# Patient Record
Sex: Female | Born: 1970 | Race: White | Hispanic: No | Marital: Married | Smoking: Never smoker
Health system: Southern US, Community
[De-identification: ages and names within clinical notes are randomized; demographics above are authoritative.]

## PROBLEM LIST (undated history)

## (undated) DIAGNOSIS — T7840XA Allergy, unspecified, initial encounter: Secondary | ICD-10-CM

## (undated) DIAGNOSIS — F419 Anxiety disorder, unspecified: Secondary | ICD-10-CM

## (undated) DIAGNOSIS — E039 Hypothyroidism, unspecified: Secondary | ICD-10-CM

## (undated) DIAGNOSIS — G43909 Migraine, unspecified, not intractable, without status migrainosus: Secondary | ICD-10-CM

## (undated) HISTORY — DX: Anxiety disorder, unspecified: F41.9

## (undated) HISTORY — PX: OTHER SURGICAL HISTORY: SHX169

## (undated) HISTORY — DX: Allergy, unspecified, initial encounter: T78.40XA

## (undated) HISTORY — DX: Migraine, unspecified, not intractable, without status migrainosus: G43.909

---

## 2000-02-24 HISTORY — PX: LEEP: SHX91

## 2010-08-13 LAB — HM PAP SMEAR: HM Pap smear: NORMAL

## 2011-08-31 ENCOUNTER — Encounter: Payer: Self-pay | Admitting: Internal Medicine

## 2011-08-31 ENCOUNTER — Ambulatory Visit (INDEPENDENT_AMBULATORY_CARE_PROVIDER_SITE_OTHER): Payer: PRIVATE HEALTH INSURANCE | Admitting: Internal Medicine

## 2011-08-31 VITALS — BP 110/68 | HR 76 | Temp 98.2°F | Resp 16 | Ht 67.75 in | Wt 194.5 lb

## 2011-08-31 DIAGNOSIS — F3281 Premenstrual dysphoric disorder: Secondary | ICD-10-CM

## 2011-08-31 DIAGNOSIS — F410 Panic disorder [episodic paroxysmal anxiety] without agoraphobia: Secondary | ICD-10-CM | POA: Insufficient documentation

## 2011-08-31 DIAGNOSIS — J309 Allergic rhinitis, unspecified: Secondary | ICD-10-CM

## 2011-08-31 DIAGNOSIS — G43909 Migraine, unspecified, not intractable, without status migrainosus: Secondary | ICD-10-CM

## 2011-08-31 DIAGNOSIS — R4589 Other symptoms and signs involving emotional state: Secondary | ICD-10-CM

## 2011-08-31 DIAGNOSIS — J302 Other seasonal allergic rhinitis: Secondary | ICD-10-CM

## 2011-08-31 DIAGNOSIS — N943 Premenstrual tension syndrome: Secondary | ICD-10-CM

## 2011-08-31 DIAGNOSIS — G43109 Migraine with aura, not intractable, without status migrainosus: Secondary | ICD-10-CM

## 2011-08-31 DIAGNOSIS — Z8619 Personal history of other infectious and parasitic diseases: Secondary | ICD-10-CM

## 2011-08-31 MED ORDER — SPIRONOLACTONE 50 MG PO TABS: 50.0000 mg | ORAL_TABLET | Freq: Every day | ORAL | Status: DC

## 2011-08-31 MED ORDER — SUMATRIPTAN SUCCINATE 100 MG PO TABS: 100.0000 mg | ORAL_TABLET | ORAL | Status: DC | PRN

## 2011-08-31 MED ORDER — PROPRANOLOL HCL 10 MG PO TABS: ORAL_TABLET | ORAL | Status: DC

## 2011-08-31 NOTE — Patient Instructions (Addendum)
Continue prenatal vitamins.  Trial of propranolol as needed for anxiety /stage fright  Spironolactone , a diuretic for use pre menstrual for edema  name brand imitrex.,,  if too $$$$ we will switch to maxalt or rel pax  We will get your old labs to see if you hav enay sings of anemia,.  etc

## 2011-08-31 NOTE — Assessment & Plan Note (Signed)
trial of spironolactone for edema

## 2011-08-31 NOTE — Assessment & Plan Note (Signed)
She wants to avoid medication that cannot be taken during pregnancy. prior trial of Wellbutrin and concerta for primary symptom of trouble concentrating in the "open" environment of her work place .  trial of propranolol

## 2011-08-31 NOTE — Progress Notes (Addendum)
Patient ID: Christy Fernandez, female   DOB: February 27, 1970, 41 y.o.   MRN: [id number] Patient Active Problem List  Diagnosis  . Migraine  . Premenstrual syndrome  . Panic disorder  . History of Lyme disease    Subjective:  CC:   Chief Complaint  Patient presents with  . New Patient    HPI:   Christy Fernandez is a 41 y.o. female who presents as a new patient to establish primary care with the chief complaint of  New patient . Has a 2 yr old  Child.  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   Married 10 yrs.  History of Lymes Disease in her late 20's    Treated with lots of abx,aggravated her  chronic premenstrual migraines.  Return to [facility] in 2005 ,  MD in [place] stopped everything, has headaches now once a month, around the time of migraines.    Past Medical History  Diagnosis Date  . Migraine     Past Surgical History  Procedure Date  . Septoplasty     Family History  Problem Relation Age of Onset  . Cancer Maternal Grandmother     colon  . Cancer Maternal Grandfather     colon  . Cancer Paternal Grandmother     breast   . Diabetes Paternal Grandfather     History   Social History  . Marital Status: Married    Spouse Name: N/A    Number of Children: N/A  . Years of Education: N/A   Occupational History  . Not on file.   Social History Main Topics  . Smoking status: Never Smoker   . Smokeless tobacco: Never Used  . Alcohol Use: Yes  . Drug Use: No  . Sexually Active: Not on file   Other Topics Concern  . Not on file   Social History Narrative  . No narrative on file         @ALLHX @    Review of Systems:   The remainder of the review of systems was negative except those addressed in the HPI.    Objective:  BP 110/68  Pulse 76  Temp 98.2 F (36.8 C) (Oral)  Resp 16  Ht 5' 7.75" (1.721 m)  Wt 194 lb 8 oz (88.225 kg)  BMI 29.79 kg/m2  SpO2 99%  LMP 08/24/2011  General appearance: alert, cooperative and  appears stated age Ears: normal TM's and external ear canals both ears Throat: lips, mucosa, and tongue normal; teeth and gums normal Neck: no adenopathy, no carotid bruit, supple, symmetrical, trachea midline and thyroid not enlarged, symmetric, no tenderness/mass/nodules Back: symmetric, no curvature. ROM normal. No CVA tenderness. Lungs: clear to auscultation bilaterally Heart: regular rate and rhythm, S1, S2 normal, no murmur, click, rub or gallop Abdomen: soft, non-tender; bowel sounds normal; no masses,  no organomegaly Pulses: 2+ and symmetric Skin: Skin color, texture, turgor normal. No rashes or lesions Lymph nodes: Cervical, supraclavicular, and axillary nodes normal.  Assessment and Plan:  Migraine resume brand name Imitrex,  Discussed Maxalt and rel pax  Premenstrual syndrome trial of spironolactone for edema  Panic disorder She wants to avoid medication that cannot be taken during pregnancy. prior trial of Wellbutrin and concerta for primary symptom of trouble concentrating in the "open" environment of her work place .  trial of propranolol   Updated Medication List Outpatient Encounter Prescriptions as of 08/31/2011  Medication Sig Dispense Refill  . mometasone (NASONEX) 50 MCG/ACT  nasal spray Place 2 sprays into the nose daily.      . Multiple Vitamin (MULTIVITAMIN) tablet Take 1 tablet by mouth daily.      . propranolol (INDERAL) 10 MG tablet 1 or 2 tablets as needed for anxiety  30 tablet  1  . spironolactone (ALDACTONE) 50 MG tablet Take 1 tablet (50 mg total) by mouth daily. As needed for fluid retention  30 tablet  3  . SUMAtriptan (IMITREX) 100 MG tablet Take 1 tablet (100 mg total) by mouth every 2 (two) hours as needed for migraine.  10 tablet  0  . DISCONTD: SUMAtriptan (IMITREX) 100 MG tablet Take 100 mg by mouth every 2 (two) hours as needed. Brand only         Orders Placed This Encounter  Procedures  . HM PAP SMEAR    No Follow-up on file.

## 2011-08-31 NOTE — Assessment & Plan Note (Signed)
resume brand name Imitrex,  Discussed Maxalt and rel pax

## 2011-09-01 ENCOUNTER — Encounter: Payer: Self-pay | Admitting: Internal Medicine

## 2011-09-02 ENCOUNTER — Other Ambulatory Visit: Payer: Self-pay | Admitting: *Deleted

## 2011-09-02 ENCOUNTER — Telehealth: Payer: Self-pay | Admitting: Internal Medicine

## 2011-09-02 MED ORDER — SUMATRIPTAN SUCCINATE 100 MG PO TABS: 100.0000 mg | ORAL_TABLET | ORAL | Status: DC | PRN

## 2011-09-02 NOTE — Telephone Encounter (Signed)
[facility] called and stated the manufacturer really don't make brand Imitrex anymore and it is hard to find.  She stated the generic is the exact same thing and wanted to know if that can be prescribed.  Please advise.

## 2011-09-02 NOTE — Telephone Encounter (Signed)
Yes please ask them to fill it with generic .  See chart

## 2011-09-03 NOTE — Telephone Encounter (Signed)
Pharmacy notified.

## 2011-11-19 ENCOUNTER — Telehealth: Payer: Self-pay | Admitting: Internal Medicine

## 2011-11-19 DIAGNOSIS — E559 Vitamin D deficiency, unspecified: Secondary | ICD-10-CM

## 2011-11-19 MED ORDER — ERGOCALCIFEROL 1.25 MG (50000 UT) PO CAPS: 50000.0000 [IU] | ORAL_CAPSULE | ORAL | Status: DC

## 2011-11-19 NOTE — Telephone Encounter (Signed)
Patient notified

## 2011-11-19 NOTE — Assessment & Plan Note (Signed)
Level of 20 tx with drisdol 50K weekly x 4

## 2011-11-19 NOTE — Telephone Encounter (Signed)
Recent labs reviewed. Cholesterol is fine except for triglycerides are little bit high at 195 (normal is 0-150) diet and exercise should take care of that. Her thyroid function was normal ; CBC was normal.   Vitamin D was low at 20 .  I recommend taking a megadose of vitamin D weekly for 4 weeks which I will call into her pharmacy. After she finishes to 4 weeks she should resume over-the-counter vitamin D 1000 units daily.

## 2011-12-18 ENCOUNTER — Other Ambulatory Visit: Payer: Self-pay | Admitting: Internal Medicine

## 2011-12-18 ENCOUNTER — Other Ambulatory Visit: Payer: Self-pay | Admitting: General Practice

## 2011-12-18 MED ORDER — ERGOCALCIFEROL 1.25 MG (50000 UT) PO CAPS: 50000.0000 [IU] | ORAL_CAPSULE | ORAL | Status: DC

## 2012-03-02 ENCOUNTER — Encounter: Payer: Self-pay | Admitting: Internal Medicine

## 2012-03-02 ENCOUNTER — Ambulatory Visit (INDEPENDENT_AMBULATORY_CARE_PROVIDER_SITE_OTHER): Payer: BC Managed Care – PPO | Admitting: Internal Medicine

## 2012-03-02 VITALS — BP 108/76 | HR 87 | Temp 98.0°F | Resp 16 | Wt 190.8 lb

## 2012-03-02 DIAGNOSIS — D239 Other benign neoplasm of skin, unspecified: Secondary | ICD-10-CM

## 2012-03-02 DIAGNOSIS — Z124 Encounter for screening for malignant neoplasm of cervix: Secondary | ICD-10-CM | POA: Insufficient documentation

## 2012-03-02 DIAGNOSIS — Z1331 Encounter for screening for depression: Secondary | ICD-10-CM

## 2012-03-02 DIAGNOSIS — S4980XA Other specified injuries of shoulder and upper arm, unspecified arm, initial encounter: Secondary | ICD-10-CM

## 2012-03-02 DIAGNOSIS — S46909A Unspecified injury of unspecified muscle, fascia and tendon at shoulder and upper arm level, unspecified arm, initial encounter: Secondary | ICD-10-CM

## 2012-03-02 DIAGNOSIS — F410 Panic disorder [episodic paroxysmal anxiety] without agoraphobia: Secondary | ICD-10-CM

## 2012-03-02 DIAGNOSIS — D229 Melanocytic nevi, unspecified: Secondary | ICD-10-CM

## 2012-03-02 DIAGNOSIS — S4992XA Unspecified injury of left shoulder and upper arm, initial encounter: Secondary | ICD-10-CM | POA: Insufficient documentation

## 2012-03-02 MED ORDER — SPIRONOLACTONE 50 MG PO TABS: 50.0000 mg | ORAL_TABLET | Freq: Every day | ORAL | Status: DC

## 2012-03-02 NOTE — Progress Notes (Signed)
Patient ID: Christy Fernandez, female   DOB: [birth year], 42 y.o.   MRN: [id number]  Patient Active Problem List  Diagnosis  . Migraine  . Panic disorder  . History of Lyme disease  . Vitamin D deficiency  . Injury of shoulder, left    Subjective:  CC:   Chief Complaint  Patient presents with  . Follow-up    HPI:   Christy Fernandez a 42 y.o. female who presents 6 month follow up on several chronic conditions,    She has noticed that her menstrual periods have become heavier but her migraines have improved.  Her husband has accepted a job in [place] blowing glass . She continues to have episodes of facial flushing related to socially tense situations, usually at work. The propranolol has helped with most of her symptoms. She is requesting a referral to dermatology for evaluation of several atypical moles.  She continues to have left shoulder pain after an inury that was treated by the staff [place] with prednisone and MR.  She has been doing her own rehab for the past several weeks with weights up to 20 lbs and has increased pain by the end of the day .     Past Medical History  Diagnosis Date  . Migraine     Past Surgical History  Procedure Date  . Septoplasty   . Leep 2002         The following portions of the patient's history were reviewed and updated as appropriate: Allergies, current medications, and problem list.    Review of Systems:   Patient denies headache, fevers, malaise, unintentional weight loss, skin rash, eye pain, sinus congestion and sinus pain, sore throat, dysphagia,  hemoptysis , cough, dyspnea, wheezing, chest pain, palpitations, orthopnea, edema, abdominal pain, nausea, melena, diarrhea, constipation, flank pain, dysuria, hematuria, urinary  Frequency, nocturia, numbness, tingling, seizures,  Focal weakness, Loss of consciousness,  Tremor, insomnia, depression, anxiety, and suicidal ideation.      History   Social History  . Marital Status: Married   Spouse Name: N/A    Number of Children: N/A  . Years of Education: N/A   Occupational History  . Not on file.   Social History Main Topics  . Smoking status: Never Smoker   . Smokeless tobacco: Never Used  . Alcohol Use: Yes  . Drug Use: No  . Sexually Active: Not on file   Other Topics Concern  . Not on file   Social History Narrative  . No narrative on file    Objective:  BP 108/76  Pulse 87  Temp 98 F (36.7 C) (Oral)  Resp 16  Wt 190 lb 12 oz (86.524 kg)  SpO2 98%  LMP 02/10/2012  General appearance: alert, cooperative and appears stated age Ears: normal TM's and external ear canals both ears Throat: lips, mucosa, and tongue normal; teeth and gums normal Neck: no adenopathy, no carotid bruit, supple, symmetrical, trachea midline and thyroid not enlarged, symmetric, no tenderness/mass/nodules Back: symmetric, no curvature. ROM normal. No CVA tenderness. Lungs: clear to auscultation bilaterally Heart: regular rate and rhythm, S1, S2 normal, no murmur, click, rub or gallop Abdomen: soft, non-tender; bowel sounds normal; no masses,  no organomegaly Pulses: 2+ and symmetric Skin: Skin color, texture, turgor normal. No rashes or lesions Lymph nodes: Cervical, supraclavicular, and axillary nodes normal.  Assessment and Plan:  Injury of shoulder, left Recommended reducing weights to 5 lbs and reducing exercise to range of motion, followed by icing ,  And ibuprofen.   Panic disorder Continue propanolol prn .   Screening for cervical cancer She will return in the summer for an aannual exam and will need PAP and mammogram.    Updated Medication List Outpatient Encounter Prescriptions as of 03/02/2012  Medication Sig Dispense Refill  . ergocalciferol (DRISDOL) 50000 UNITS capsule Take 1 capsule (50,000 Units total) by mouth once a week.  4 capsule  0  . mometasone (NASONEX) 50 MCG/ACT nasal spray Place 2 sprays into the nose daily.      . Multiple Vitamin  (MULTIVITAMIN) tablet Take 1 tablet by mouth daily.      . propranolol (INDERAL) 10 MG tablet 1 or 2 tablets as needed for anxiety  30 tablet  1  . spironolactone (ALDACTONE) 50 MG tablet Take 1 tablet (50 mg total) by mouth daily. As needed for fluid retention  30 tablet  3  . SUMAtriptan (IMITREX) 100 MG tablet Take 1 tablet (100 mg total) by mouth every 2 (two) hours as needed for migraine.  10 tablet  0  . [DISCONTINUED] spironolactone (ALDACTONE) 50 MG tablet Take 1 tablet (50 mg total) by mouth daily. As needed for fluid retention  30 tablet  3  . spironolactone (ALDACTONE) 50 MG tablet Take 1 tablet (50 mg total) by mouth daily.  30 tablet  3

## 2012-03-02 NOTE — Assessment & Plan Note (Signed)
Continue propanolol prn

## 2012-03-02 NOTE — Assessment & Plan Note (Signed)
She will return in the summer for an aannual exam and will need PAP and mammogram.

## 2012-03-02 NOTE — Patient Instructions (Addendum)
Increase D to 2000 units daily  Drop weights to 5 lbs max for shoulder  ice shoulder and use 400 mg ibuprofen after exercise  Max is 800 mg one time dose daily

## 2012-03-02 NOTE — Assessment & Plan Note (Signed)
Recommended reducing weights to 5 lbs and reducing exercise to range of motion, followed by icing ,  And ibuprofen.

## 2012-09-02 ENCOUNTER — Encounter: Payer: Self-pay | Admitting: Internal Medicine

## 2012-09-02 ENCOUNTER — Ambulatory Visit (INDEPENDENT_AMBULATORY_CARE_PROVIDER_SITE_OTHER): Payer: BC Managed Care – PPO | Admitting: Internal Medicine

## 2012-09-02 ENCOUNTER — Encounter: Payer: Self-pay | Admitting: Emergency Medicine

## 2012-09-02 VITALS — BP 110/72 | HR 78 | Temp 98.4°F | Resp 14 | Ht 68.0 in | Wt 187.2 lb

## 2012-09-02 DIAGNOSIS — M25551 Pain in right hip: Secondary | ICD-10-CM

## 2012-09-02 DIAGNOSIS — Z1239 Encounter for other screening for malignant neoplasm of breast: Secondary | ICD-10-CM

## 2012-09-02 DIAGNOSIS — E663 Overweight: Secondary | ICD-10-CM

## 2012-09-02 DIAGNOSIS — Z0000 Encounter for general adult medical examination without abnormal findings: Secondary | ICD-10-CM

## 2012-09-02 DIAGNOSIS — M25559 Pain in unspecified hip: Secondary | ICD-10-CM

## 2012-09-02 DIAGNOSIS — Z6825 Body mass index (BMI) 25.0-25.9, adult: Secondary | ICD-10-CM

## 2012-09-02 DIAGNOSIS — M25552 Pain in left hip: Secondary | ICD-10-CM

## 2012-09-02 NOTE — Patient Instructions (Addendum)
You do not need a PAP smear until 2015.  I have ordered plain films of your  and hips to evaluate your bilateral hip pain  followed by appt with Dr Shirlyn Goltz at [facility] can  use celebrex (one tablet daily) OR Aleve,   AND up to 4 tylenol daily for pain   I am sending you for your first mammogram to [facility] on [address]  You are due for annual labs in mid September.  Your trgilcyerides were a little elevated last year, so I recommend a low glycemic index diet.   This is  my version of a  "Low GI"  Diet:  It is not ultra low carb, but will still lower your blood sugars and allow you to lose 5 to 10 lbs per month if you follow it carefully. All of the foods can be found at grocery stores and in bulk at [employer].  The Atkins protein bars and shakes are available in more varieties at Target, WalMart and [employer] Foods.     7 AM Breakfast:  Low carbohydrate Protein  Shakes (I recommend the EAS AdvantEdge "Carb Control" shakes  Or the low carb shakes by Atkins.   Both are available everywhere:  In  cases at BJs  Or in 4 packs at grocery stores and pharmacies  2.5 carbs  (Alternative is  a toasted Arnold's Sandwhich Thin w/ peanut butter, a "Bagel Thin" with cream cheese and salmon) or  a scrambled egg burrito made with a low carb tortilla .  Avoid cereal and bananas, oatmeal too unless you are cooking the old fashioned kind that takes 30-40 minutes to prepare.  the rest is overly processed, has minimal fiber, and is loaded with carbohydrates!   10 AM: Protein bar by Atkins (the snack size, under 200 cal).  There are many varieties , available widely again or in bulk in limited varieties at BJs)  Other so called "protein bars" tend to be loaded with carbohydrates.  Remember, in food advertising, the word "energy" is synonymous for " carbohydrate."  Lunch: sandwich of [place], (or any lunchmeat, grilled meat or canned tuna), fresh avocado, mayonnaise  and cheese on a lower carbohydrate pita  bread, flatbread, or tortilla . Ok to use regular mayonnaise. The bread is the only source or carbohydrate that can be decreased (Joseph's makes a pita bread and a flat bread that are 50 cal and 4 net carbs ; Toufayan makes a low carb flatbread that's 100 cal and 9 net carbs  and  Mission makes a low carb whole wheat tortilla  That is 210 cal and 6 net carbs)  3 PM:  Mid day :  Another protein bar,  Or a  cheese stick (100 cal, 0 carbs),  Or 1 ounce of  almonds, walnuts, pistachios, pecans, peanuts,  Macadamia nuts. Or a Dannon light n Fit greek yogurt, 80 cal 8 net carbs . Avoid "granola"; the dried cranberries and raisins are loaded with carbohydrates. Mixed nuts ok if no raisins or cranberries or dried fruit.      6 PM  Dinner:  "mean and green:"  Meat/chicken/fish or a high protein legume; , with a green salad, and a low GI  Veggie (broccoli, cauliflower, green beans, spinach, brussel sprouts. Lima beans) : Avoid "Low fat dressings, as well as Reyne Dumas and [address]! They are loaded with sugar! Instead use ranch, vinagrette,  Blue cheese, etc.  There is a low carb pasta by Dreamfield's available at  Lowe's grocery that is acceptable and tastes great. Try Michel Angel's chicken piccata over low carb pasta. The chicken dish is 0 carbs, and can be found in frozen section at BJs and [employer]'s. Also try [employer]" (pulled pork, no sauce,  0 carbs) and his pot roast.   both are in the refrigerated section at BJs   Dreamfield's makes a low carb pasta only 5 g/serving.  Available at all grocery stores,  And tastes like normal pasta  9 PM snack : Breyer's "low carb" fudgsicle or  ice cream bar (Carb Smart line), or  Weight Watcher's ice cream bar , or another "no sugar added" ice cream;a serving of fresh berries/cherries with whipped cream (Avoid bananas, pineapple, grapes  and watermelon on a regular basis because they are high in sugar)   Remember that snack Substitutions should be less  than 10 carbs per serving and meals < 20 carbs. Remember to subtract fiber grams and sugar alcohols to get the "net carbs."

## 2012-09-02 NOTE — Progress Notes (Signed)
Patient ID: Christy Fernandez, female   DOB: [birth year], 42 y.o.   MRN: [id number]   Subjective:     Christy Fernandez is a 42 y.o. female and is here for a comprehensive physical exam. The patient reports problems - of weight gain, bilateral hip pain and dyspnea with exertion.  History   Social History  . Marital Status: Married    Spouse Name: N/A    Number of Children: N/A  . Years of Education: N/A   Occupational History  . Not on file.   Social History Main Topics  . Smoking status: Never Smoker   . Smokeless tobacco: Never Used  . Alcohol Use: Yes  . Drug Use: No  . Sexually Active: Not on file   Other Topics Concern  . Not on file   Social History Narrative  . No narrative on file   Health Maintenance  Topic Date Due  . Influenza Vaccine  10/24/2012  . Pap Smear  03/03/2015  . Tetanus/tdap  03/03/2019    The following portions of the patient's history were reviewed and updated as appropriate: allergies, current medications, past family history, past medical history, past social history, past surgical history and problem list.  Review of Systems A comprehensive review of systems was negative.   Objective:   BP 110/72  Pulse 78  Temp(Src) 98.4 F (36.9 C) (Oral)  Resp 14  Ht 5\' 8"  (1.727 m)  Wt 187 lb 4 oz (84.936 kg)  BMI 28.48 kg/m2  SpO2 98%  LMP 08/09/2012  BP 110/72  Pulse 78  Temp(Src) 98.4 F (36.9 C) (Oral)  Resp 14  Ht 5\' 8"  (1.727 m)  Wt 187 lb 4 oz (84.936 kg)  BMI 28.48 kg/m2  SpO2 98%  LMP 08/09/2012  General Appearance:    Alert, cooperative, no distress, appears stated age  Head:    Normocephalic, without obvious abnormality, atraumatic  Eyes:    PERRL, conjunctiva/corneas clear, EOM's intact, fundi    benign, both eyes  Ears:    Normal TM's and external ear canals, both ears  Nose:   Nares normal, septum midline, mucosa normal, no drainage    or sinus tenderness  Throat:   Lips, mucosa, and tongue normal; teeth and gums normal  Neck:    Supple, symmetrical, trachea midline, no adenopathy;    thyroid:  no enlargement/tenderness/nodules; no carotid   bruit or JVD  Back:     Symmetric, no curvature, ROM normal, no CVA tenderness  Lungs:     Clear to auscultation bilaterally, respirations unlabored  Chest Wall:    No tenderness or deformity   Heart:    Regular rate and rhythm, S1 and S2 normal, no murmur, rub   or gallop  Breast Exam:    No tenderness, masses, or nipple abnormality  Abdomen:     Soft, non-tender, bowel sounds active all four quadrants,    no masses, no organomegaly  Extremities:   Extremities normal, atraumatic, no cyanosis or edema  Pulses:   2+ and symmetric all extremities  Skin:   Skin color, texture, turgor normal, no rashes or lesions  Lymph nodes:   Cervical, supraclavicular, and axillary nodes normal  Neurologic:   CNII-XII intact, normal strength, sensation and reflexes    throughout    Assessment:   Bilateral hip pain She has no signs of synovitis or PAD on exam  .  Plain films ordered followed by physiatry evaluation with Dr Shirlyn Goltz at [place]   Overweight (BMI 25.0-29.9) She  has lost 7 lbs since last year but her BMI remains 28.  I have addressed  BMI and recommended a low glycemic index diet utilizing smaller more frequent meals to increase metabolism.  I have also recommended that patient start exercising with a goal of 30 minutes of aerobic exercise a minimum of 5 days per week. Screening for lipid disorders, thyroid and diabetes to be done today.     Routine general medical examination at a health care facility Annual comprehensive exam was done including breast without pelvic exam was done .   All screenings have been addressed .    Updated Medication List Outpatient Encounter Prescriptions as of 09/02/2012  Medication Sig Dispense Refill  . mometasone (NASONEX) 50 MCG/ACT nasal spray Place 2 sprays into the nose daily.      . Multiple Vitamin (MULTIVITAMIN) tablet Take 1 tablet by  mouth daily.      . propranolol (INDERAL) 10 MG tablet 1 or 2 tablets as needed for anxiety  30 tablet  1  . spironolactone (ALDACTONE) 50 MG tablet Take 1 tablet (50 mg total) by mouth daily. As needed for fluid retention  30 tablet  3  . ergocalciferol (DRISDOL) 50000 UNITS capsule Take 1 capsule (50,000 Units total) by mouth once a week.  4 capsule  0  . SUMAtriptan (IMITREX) 100 MG tablet Take 1 tablet (100 mg total) by mouth every 2 (two) hours as needed for migraine.  10 tablet  0  . [DISCONTINUED] spironolactone (ALDACTONE) 50 MG tablet Take 1 tablet (50 mg total) by mouth daily.  30 tablet  3   No facility-administered encounter medications on file as of 09/02/2012.

## 2012-09-04 ENCOUNTER — Encounter: Payer: Self-pay | Admitting: Internal Medicine

## 2012-09-04 DIAGNOSIS — M25551 Pain in right hip: Secondary | ICD-10-CM | POA: Insufficient documentation

## 2012-09-04 DIAGNOSIS — Z0000 Encounter for general adult medical examination without abnormal findings: Secondary | ICD-10-CM | POA: Insufficient documentation

## 2012-09-04 DIAGNOSIS — Z0001 Encounter for general adult medical examination with abnormal findings: Secondary | ICD-10-CM | POA: Insufficient documentation

## 2012-09-04 DIAGNOSIS — E663 Overweight: Secondary | ICD-10-CM | POA: Insufficient documentation

## 2012-09-04 DIAGNOSIS — M25552 Pain in left hip: Secondary | ICD-10-CM | POA: Insufficient documentation

## 2012-09-04 NOTE — Assessment & Plan Note (Signed)
Annual comprehensive exam was done including breast without pelvic exam was done .   All screenings have been addressed .

## 2012-09-04 NOTE — Assessment & Plan Note (Signed)
She has lost 7 lbs since last year but her BMI remains 28.  I have addressed  BMI and recommended a low glycemic index diet utilizing smaller more frequent meals to increase metabolism.  I have also recommended that patient start exercising with a goal of 30 minutes of aerobic exercise a minimum of 5 days per week. Screening for lipid disorders, thyroid and diabetes to be done today.

## 2012-09-04 NOTE — Assessment & Plan Note (Signed)
She has no signs of synovitis or PAD on exam  .  Plain films ordered followed by physiatry evaluation with Dr Shirlyn Goltz at [place]

## 2012-09-13 ENCOUNTER — Other Ambulatory Visit: Payer: BC Managed Care – PPO

## 2012-09-15 ENCOUNTER — Other Ambulatory Visit (INDEPENDENT_AMBULATORY_CARE_PROVIDER_SITE_OTHER): Payer: BC Managed Care – PPO

## 2012-09-15 ENCOUNTER — Telehealth: Payer: Self-pay | Admitting: Internal Medicine

## 2012-09-15 DIAGNOSIS — M25552 Pain in left hip: Secondary | ICD-10-CM

## 2012-09-15 DIAGNOSIS — M25559 Pain in unspecified hip: Secondary | ICD-10-CM

## 2012-09-15 DIAGNOSIS — R0609 Other forms of dyspnea: Secondary | ICD-10-CM | POA: Insufficient documentation

## 2012-09-15 DIAGNOSIS — M25551 Pain in right hip: Secondary | ICD-10-CM

## 2012-09-15 LAB — POCT URINE PREGNANCY: Preg Test, Ur: NEGATIVE

## 2012-09-15 NOTE — Telephone Encounter (Signed)
Sure, ordered but I need to know where she is going for the films. [facility] or [place])

## 2012-09-15 NOTE — Telephone Encounter (Signed)
Patient is waiting to her from lab results to get hip x-ray and said you had mentioned a chest X-Ray for black mold please advise ?

## 2012-09-15 NOTE — Telephone Encounter (Signed)
Pt came in today for labs and stated that dr Darrick Huntsman mention about her getting a lung xray because she was exposed to black mold.  She has to go for hip xray after she gets lab results back and wanted to know if she could get chest xray at same time.  Please advise

## 2012-09-15 NOTE — Telephone Encounter (Signed)
She had a urine pregnancy test,  Negative.,  The x rays of hips were ordered.   i do not recall the discussion about black mold., please find out what symptoms she is having

## 2012-09-15 NOTE — Telephone Encounter (Signed)
Patient stated she has SOB with exertion walking and especially walking up stairs or up hill. So, she just wondered since she was having the hip X-ray does she need the chest also while she is there?

## 2012-09-16 NOTE — Telephone Encounter (Signed)
Amber can you change the locale of previiously ordered x rays (hip and chest) to be done at [facility]?  Thank you

## 2012-09-16 NOTE — Telephone Encounter (Signed)
Christy Fernandez see previous request  Thanks

## 2012-09-16 NOTE — Telephone Encounter (Signed)
The patient is aware that she can go to [facility] and get her xray's completed.

## 2012-09-16 NOTE — Telephone Encounter (Signed)
Pt states she can have x rays done at [facility]. Pt requests call when order complete so she knows when to go to [place] office.

## 2012-09-23 ENCOUNTER — Ambulatory Visit (INDEPENDENT_AMBULATORY_CARE_PROVIDER_SITE_OTHER)
Admission: RE | Admit: 2012-09-23 | Discharge: 2012-09-23 | Disposition: A | Discharge status: Home / Self Care | Payer: BC Managed Care – PPO | Source: Ambulatory Visit | Attending: Internal Medicine | Admitting: Internal Medicine

## 2012-09-23 DIAGNOSIS — R0989 Other specified symptoms and signs involving the circulatory and respiratory systems: Secondary | ICD-10-CM

## 2012-09-23 DIAGNOSIS — M25552 Pain in left hip: Secondary | ICD-10-CM

## 2012-09-23 DIAGNOSIS — R0609 Other forms of dyspnea: Secondary | ICD-10-CM

## 2012-09-23 DIAGNOSIS — M25551 Pain in right hip: Secondary | ICD-10-CM

## 2012-09-23 DIAGNOSIS — M25559 Pain in unspecified hip: Secondary | ICD-10-CM

## 2012-09-29 ENCOUNTER — Ambulatory Visit
Admission: RE | Admit: 2012-09-29 | Discharge: 2012-09-29 | Disposition: A | Discharge status: Home / Self Care | Payer: BC Managed Care – PPO | Source: Ambulatory Visit | Attending: Internal Medicine | Admitting: Internal Medicine

## 2012-09-29 DIAGNOSIS — Z1239 Encounter for other screening for malignant neoplasm of breast: Secondary | ICD-10-CM

## 2013-03-22 ENCOUNTER — Ambulatory Visit (INDEPENDENT_AMBULATORY_CARE_PROVIDER_SITE_OTHER): Payer: BC Managed Care – PPO | Admitting: Internal Medicine

## 2013-03-22 ENCOUNTER — Encounter: Payer: Self-pay | Admitting: Internal Medicine

## 2013-03-22 VITALS — BP 110/80 | HR 77 | Temp 98.1°F | Resp 12 | Ht 68.0 in | Wt 189.5 lb

## 2013-03-22 DIAGNOSIS — G43909 Migraine, unspecified, not intractable, without status migrainosus: Secondary | ICD-10-CM

## 2013-03-22 DIAGNOSIS — N9089 Other specified noninflammatory disorders of vulva and perineum: Secondary | ICD-10-CM

## 2013-03-22 MED ORDER — SUMATRIPTAN SUCCINATE 100 MG PO TABS: 100.0000 mg | ORAL_TABLET | ORAL | Status: DC | PRN

## 2013-03-22 NOTE — Progress Notes (Signed)
Patient ID: Christy Fernandez, female   DOB: 07/15/1970, 43 y.o.   MRN: [id number]  Patient Active Problem List   Diagnosis Date Noted  . Labial skin tag 03/23/2013  . Exertional dyspnea 09/15/2012  . Bilateral hip pain 09/04/2012  . Overweight (BMI 25.0-29.9) 09/04/2012  . Routine general medical examination at a health care facility 09/04/2012  . Injury of shoulder, left 03/02/2012  . Screening for cervical cancer 03/02/2012  . Vitamin D deficiency 11/19/2011  . Panic disorder 08/31/2011  . History of Lyme disease 08/31/2011  . Migraine     Subjective:  CC:   Chief Complaint  Patient presents with  . Personal Problem    Possible vaginal tear-happened 3 weeks ago. Tries salt baths & seems better but wanted it checked out    HPI:   Christy Fernandez a 43 y.o. female who presents  Past Medical History  Diagnosis Date  . Migraine     Past Surgical History  Procedure Laterality Date  . Septoplasty    . Leep  2002       The following portions of the patient's history were reviewed and updated as appropriate: Allergies, current medications, and problem list.    Review of Systems:   12 Pt  review of systems was negative except those addressed in the HPI,     History   Social History  . Marital Status: Married    Spouse Name: N/A    Number of Children: N/A  . Years of Education: N/A   Occupational History  . Not on file.   Social History Main Topics  . Smoking status: Never Smoker   . Smokeless tobacco: Never Used  . Alcohol Use: Yes  . Drug Use: No  . Sexual Activity: Not on file   Other Topics Concern  . Not on file   Social History Narrative  . No narrative on file    Objective:  Filed Vitals:   03/22/13 1610  BP: 110/80  Pulse: 77  Temp: 98.1 F (36.7 C)  Resp: 12    General Appearance:    Alert, cooperative, no distress, appears stated age  Head:    Normocephalic, without obvious abnormality, atraumatic  Eyes:    PERRL,  conjunctiva/corneas clear, EOM's intact  Neck:   Supple, symmetrical, trachea midline, no adenopathy;    thyroid:  no enlargement/tenderness/nodules; no carotid   bruit or JVD  Back:     Symmetric, no curvature, ROM normal, no CVA tenderness  Lungs:     Clear to auscultation bilaterally, respirations unlabored  Chest Wall:    No tenderness or deformity   Heart:    Regular rate and rhythm, S1 and S2 normal, no murmur, rub   or gallop  Abdomen:     Soft, non-tender, bowel sounds active all four quadrants,    no masses, no organomegaly  Genitalia:    Pelvic: cervix normal in appearance, external genitalia normal except for 5 mm pedunculated skin tag on right labial minora , no adnexal masses or tenderness, no cervical motion tenderness, rectovaginal septum normal, uterus normal size, shape, and consistency and vagina normal without discharge  Extremities:   Extremities normal, atraumatic, no cyanosis or edema  Pulses:   2+ and symmetric all extremities  Skin:   Skin color, texture, turgor normal, no rashes or lesions  Lymph nodes:   Cervical, supraclavicular, and axillary nodes normal  Neurologic:   CNII-XII intact, normal strength, sensation and reflexes    throughout    Assessment  and Plan:  Migraine Her migraine frequency has improved to just one per month primarily occurring before the onset of menses. The headaches last 2-3 days however and she is requesting a refill on Imitrex but has noted that the generic Imitrex did not work as well as the brand name. She states that she uses the generic she has to take multiple  doses because the duration of effect is shorter. She has tried combining these with ibuprofen with no significant improvement. I have written a prescription for brand-name only and warned her that this may take prior authorization to make it available to her.  Labial skin tag Pelvic exam was done today with speculum. Vaginal walls and vault were normal with no signs of  trauma. Her right labial minora has a pedunculated skin tag which is the result of the trauma she experienced a few weeks ago during consensual intercourse. She was hoping to avoid referral to gynecology. I told her that I was not comfortable excising even a minor tag given the lack of appropriate training so  I  elected to tie a Vicryl suture around the base of skin tag to cut off the blood supply . I told her it should gradually darken and fall off. Told her that if it it becomes painful or irritated to call and I would set her with gynecology to have it surgically removed.    Updated Medication List Outpatient Encounter Prescriptions as of 03/22/2013  Medication Sig  . ergocalciferol (DRISDOL) 50000 UNITS capsule Take 1 capsule (50,000 Units total) by mouth once a week.  . mometasone (NASONEX) 50 MCG/ACT nasal spray Place 2 sprays into the nose daily.  . Multiple Vitamin (MULTIVITAMIN) tablet Take 1 tablet by mouth daily.  . propranolol (INDERAL) 10 MG tablet 1 or 2 tablets as needed for anxiety  . spironolactone (ALDACTONE) 50 MG tablet Take 1 tablet (50 mg total) by mouth daily. As needed for fluid retention  . [DISCONTINUED] SUMAtriptan (IMITREX) 100 MG tablet Take 1 tablet (100 mg total) by mouth every 2 (two) hours as needed for migraine.  . SUMAtriptan (IMITREX) 100 MG tablet Take 1 tablet (100 mg total) by mouth every 2 (two) hours as needed for migraine or headache. NAME BRAND ONLY,   GENERIC IS INEFFECTIVE May repeat in 2 hours if headache persists or recurs.     No orders of the defined types were placed in this encounter.    No Follow-up on file.

## 2013-03-23 ENCOUNTER — Telehealth: Payer: Self-pay | Admitting: Internal Medicine

## 2013-03-23 ENCOUNTER — Encounter: Payer: Self-pay | Admitting: Internal Medicine

## 2013-03-23 DIAGNOSIS — N9089 Other specified noninflammatory disorders of vulva and perineum: Secondary | ICD-10-CM | POA: Insufficient documentation

## 2013-03-23 NOTE — Assessment & Plan Note (Signed)
Her migraine frequency has improved to just one per month primarily occurring before the onset of menses. The headaches last 2-3 days however and she is requesting a refill on Imitrex but has noted that the generic Imitrex did not work as well as the brand name. She states that she uses the generic she has to take multiple  doses because the duration of effect is shorter. She has tried combining these with ibuprofen with no significant improvement. I have written a prescription for brand-name only and warned her that this may take prior authorization to make it available to her.

## 2013-03-23 NOTE — Assessment & Plan Note (Addendum)
Pelvic exam was done today with speculum. Vaginal walls and vault were normal with no signs of trauma. Her right labial minora has a pedunculated skin tag which is the result of the trauma she experienced a few weeks ago during consensual intercourse. She was hoping to avoid referral to gynecology. I told her that I was not comfortable excising even a minor tag given the lack of appropriate training so  I  elected to tie a Vicryl suture around the base of skin tag to cut off the blood supply . I told her it should gradually darken and fall off. Told her that if it it becomes painful or irritated to call and I would set her with gynecology to have it surgically removed.

## 2013-03-23 NOTE — Telephone Encounter (Signed)
LMTCB

## 2013-03-23 NOTE — Telephone Encounter (Signed)
Dr. Derrel Nip is Calling to make sure she is not too uncomfortable after yesterday's   visit

## 2013-03-24 NOTE — Telephone Encounter (Signed)
Pt states that she is not uncomfortable but so far things are unchanged. She will give it more time & call back on Monday with an update

## 2013-04-01 ENCOUNTER — Other Ambulatory Visit: Payer: Self-pay | Admitting: Internal Medicine

## 2013-04-01 DIAGNOSIS — N9089 Other specified noninflammatory disorders of vulva and perineum: Secondary | ICD-10-CM

## 2013-04-01 DIAGNOSIS — S3141XA Laceration without foreign body of vagina and vulva, initial encounter: Secondary | ICD-10-CM

## 2013-05-05 LAB — HM PAP SMEAR: HM Pap smear: NORMAL

## 2013-09-04 ENCOUNTER — Encounter (INDEPENDENT_AMBULATORY_CARE_PROVIDER_SITE_OTHER): Payer: Self-pay

## 2013-09-04 ENCOUNTER — Encounter: Payer: Self-pay | Admitting: Internal Medicine

## 2013-09-04 ENCOUNTER — Ambulatory Visit (INDEPENDENT_AMBULATORY_CARE_PROVIDER_SITE_OTHER): Payer: BC Managed Care – PPO | Admitting: Internal Medicine

## 2013-09-04 VITALS — BP 100/68 | HR 79 | Temp 98.6°F | Resp 16 | Ht 68.75 in | Wt 189.5 lb

## 2013-09-04 DIAGNOSIS — Z0000 Encounter for general adult medical examination without abnormal findings: Secondary | ICD-10-CM

## 2013-09-04 DIAGNOSIS — N9089 Other specified noninflammatory disorders of vulva and perineum: Secondary | ICD-10-CM

## 2013-09-04 DIAGNOSIS — Z7189 Other specified counseling: Secondary | ICD-10-CM

## 2013-09-04 DIAGNOSIS — Z1239 Encounter for other screening for malignant neoplasm of breast: Secondary | ICD-10-CM

## 2013-09-04 DIAGNOSIS — E559 Vitamin D deficiency, unspecified: Secondary | ICD-10-CM

## 2013-09-04 DIAGNOSIS — G43829 Menstrual migraine, not intractable, without status migrainosus: Secondary | ICD-10-CM

## 2013-09-04 DIAGNOSIS — IMO0002 Reserved for concepts with insufficient information to code with codable children: Secondary | ICD-10-CM

## 2013-09-04 DIAGNOSIS — E663 Overweight: Secondary | ICD-10-CM

## 2013-09-04 DIAGNOSIS — Z124 Encounter for screening for malignant neoplasm of cervix: Secondary | ICD-10-CM

## 2013-09-04 MED ORDER — PROPRANOLOL HCL 10 MG PO TABS: ORAL_TABLET | ORAL | Status: DC

## 2013-09-04 MED ORDER — SUMATRIPTAN SUCCINATE 100 MG PO TABS: 100.0000 mg | ORAL_TABLET | Freq: Once | ORAL | Status: DC

## 2013-09-04 NOTE — Patient Instructions (Signed)
You had your annual physical  today.  We will repeat your PAP smear in 2018.  We will schedule your mammogram at [facility] .   Please get your fasting labs at your earliest convenience, and ask about being screened for hepatitis C and B   We will contact you with the bloodwork results

## 2013-09-04 NOTE — Progress Notes (Signed)
Patient ID: Christy Fernandez, female   DOB: [birth year], 43 y.o.   MRN: [id number]   Subjective:     Christy Fernandez is a 43 y.o. female and is here for a comprehensive physical exam. The patient reports no problems..  She is travelling to [place] in a few monhs and is inquiring  About need for vaccines .   History   Social History  . Marital Status: Married    Spouse Name: N/A    Number of Children: N/A  . Years of Education: N/A   Occupational History  . Not on file.   Social History Main Topics  . Smoking status: Never Smoker   . Smokeless tobacco: Never Used  . Alcohol Use: Yes  . Drug Use: No  . Sexual Activity: Not on file   Other Topics Concern  . Not on file   Social History Narrative  . No narrative on file   Health Maintenance  Topic Date Due  . Influenza Vaccine  09/23/2013  . Pap Smear  05/05/2016  . Tetanus/tdap  03/03/2019    The following portions of the patient's history were reviewed and updated as appropriate: allergies, current medications, past family history, past medical history, past social history, past surgical history and problem list.  Review of Systems A comprehensive review of systems was negative.   Objective:   BP 100/68  Pulse 79  Temp(Src) 98.6 F (37 C) (Oral)  Resp 16  Ht 5' 8.75" (1.746 m)  Wt 189 lb 8 oz (85.957 kg)  BMI 28.20 kg/m2  SpO2 99%  LMP 08/15/2013  General appearance: alert, cooperative and appears stated age Head: Normocephalic, without obvious abnormality, atraumatic Eyes: conjunctivae/corneas clear. PERRL, EOM's intact. Fundi benign. Ears: normal TM's and external ear canals both ears Nose: Nares normal. Septum midline. Mucosa normal. No drainage or sinus tenderness. Throat: lips, mucosa, and tongue normal; teeth and gums normal Neck: no adenopathy, no carotid bruit, no JVD, supple, symmetrical, trachea midline and thyroid not enlarged, symmetric, no tenderness/mass/nodules Lungs: clear to auscultation  bilaterally Breasts: normal appearance, no masses or tenderness Heart: regular rate and rhythm, S1, S2 normal, no murmur, click, rub or gallop Abdomen: soft, non-tender; bowel sounds normal; no masses,  no organomegaly Extremities: extremities normal, atraumatic, no cyanosis or edema Pulses: 2+ and symmetric Skin: Skin color, texture, turgor normal. No rashes or lesions Neurologic: Alert and oriented X 3, normal strength and tone. Normal symmetric reflexes. Normal coordination and gait.    Assessment and Plan:   Labial skin tag Conservative attempts to remove the pedunculated skin failed and she ultimately required [facility] referral for excision under local anesthesai which was quite painful by report.  Awaiting records from [facility].  Routine general medical examination at a health care facility Annual comprehensive exam was done including breast exam without pelvic  exam. All screenings have been addressed .   Overweight (BMI 25.0-29.9) I have addressed  BMI and recommended a low glycemic index diet utilizing smaller more frequent meals to increase metabolism.  I have also recommended that patient start exercising with a goal of 30 minutes of aerobic exercise a minimum of 5 days per week. Screening for lipid disorders, thyroid and diabetes to be done .    Screening for cervical cancer Her PAP smear was reportedly done by [facility] during removal of labial skin tag.  Records are pending recept.  Her last available/ documented PAP smear was normal in 2013 and she has no history of abnormal PAPs or change in  sexual partners since then.    Vitamin D deficiency Level of 20 treated with 50L units Vit D weekly x 1 month.  Repeat due    Advice or immunization for travel She is travelling to [place] in a few months and has concerns about any travel immunizations.  Since parts of [place] are at risk for malaria as well as food borne illnesses including typhoid and hepatitis A,  Will recommend  referral to travel clinic  Migraine Occurring monthly premenses.  Refilling generic imitrex today   Updated Medication List Outpatient Encounter Prescriptions as of 09/04/2013  Medication Sig  . mometasone (NASONEX) 50 MCG/ACT nasal spray Place 2 sprays into the nose daily.  . Multiple Vitamin (MULTIVITAMIN) tablet Take 1 tablet by mouth daily.  . propranolol (INDERAL) 10 MG tablet 1 or 2 tablets as needed for anxiety  . SUMAtriptan (IMITREX) 100 MG tablet Take 1 tablet (100 mg total) by mouth once. May repeat in 2 hours if headache persists or recurs.  . [DISCONTINUED] propranolol (INDERAL) 10 MG tablet 1 or 2 tablets as needed for anxiety  . [DISCONTINUED] SUMAtriptan (IMITREX) 100 MG tablet Take 1 tablet (100 mg total) by mouth every 2 (two) hours as needed for migraine or headache. NAME BRAND ONLY,   GENERIC IS INEFFECTIVE May repeat in 2 hours if headache persists or recurs.  . ergocalciferol (DRISDOL) 50000 UNITS capsule Take 1 capsule (50,000 Units total) by mouth once a week.  . spironolactone (ALDACTONE) 50 MG tablet Take 1 tablet (50 mg total) by mouth daily. As needed for fluid retention

## 2013-09-04 NOTE — Progress Notes (Signed)
Pre-visit discussion using our clinic review tool. No additional management support is needed unless otherwise documented below in the visit note.  

## 2013-09-05 DIAGNOSIS — IMO0002 Reserved for concepts with insufficient information to code with codable children: Secondary | ICD-10-CM | POA: Insufficient documentation

## 2013-09-05 NOTE — Assessment & Plan Note (Addendum)
I have addressed  BMI and recommended a low glycemic index diet utilizing smaller more frequent meals to increase metabolism.  I have also recommended that patient start exercising with a goal of 30 minutes of aerobic exercise a minimum of 5 days per week. Screening for lipid disorders, thyroid and diabetes to be done   

## 2013-09-05 NOTE — Assessment & Plan Note (Addendum)
Her PAP smear was reportedly done by [facility] during removal of labial skin tag.  Records are pending recept.  Her last available/ documented PAP smear was normal in 2013 and she has no history of abnormal PAPs or change in sexual partners since then.

## 2013-09-05 NOTE — Assessment & Plan Note (Signed)
Occurring monthly premenses.  Refilling generic imitrex today

## 2013-09-05 NOTE — Assessment & Plan Note (Signed)
She is travelling to [place] in a few months and has concerns about any travel immunizations.  Since parts of [place] are at risk for malaria as well as food borne illnesses including typhoid and hepatitis A,  Will recommend referral to travel clinic

## 2013-09-05 NOTE — Assessment & Plan Note (Signed)
Level of 20 treated with 50L units Vit D weekly x 1 month.  Repeat due

## 2013-09-05 NOTE — Assessment & Plan Note (Signed)
Annual comprehensive exam was done including breast exam without pelvic  exam. All screenings have been addressed .

## 2013-09-05 NOTE — Assessment & Plan Note (Signed)
Conservative attempts to remove the pedunculated skin failed and she ultimately required [facility] referral for excision under local anesthesai which was quite painful by report.  Awaiting records from [facility].

## 2013-09-15 ENCOUNTER — Other Ambulatory Visit: Payer: Self-pay

## 2013-09-15 DIAGNOSIS — Z1231 Encounter for screening mammogram for malignant neoplasm of breast: Secondary | ICD-10-CM

## 2013-10-06 ENCOUNTER — Encounter (INDEPENDENT_AMBULATORY_CARE_PROVIDER_SITE_OTHER): Payer: Self-pay

## 2013-10-06 ENCOUNTER — Ambulatory Visit
Admission: RE | Admit: 2013-10-06 | Discharge: 2013-10-06 | Disposition: A | Discharge status: Home / Self Care | Payer: BC Managed Care – PPO | Source: Ambulatory Visit

## 2013-10-06 DIAGNOSIS — Z1231 Encounter for screening mammogram for malignant neoplasm of breast: Secondary | ICD-10-CM

## 2014-01-28 ENCOUNTER — Other Ambulatory Visit: Payer: Self-pay | Admitting: Internal Medicine

## 2015-01-22 ENCOUNTER — Other Ambulatory Visit: Payer: Self-pay

## 2015-01-22 DIAGNOSIS — Z1231 Encounter for screening mammogram for malignant neoplasm of breast: Secondary | ICD-10-CM

## 2015-02-28 ENCOUNTER — Ambulatory Visit
Admission: RE | Admit: 2015-02-28 | Discharge: 2015-02-28 | Disposition: A | Discharge status: Home / Self Care | Payer: BLUE CROSS/BLUE SHIELD | Source: Ambulatory Visit

## 2015-02-28 DIAGNOSIS — Z1231 Encounter for screening mammogram for malignant neoplasm of breast: Secondary | ICD-10-CM

## 2015-04-10 ENCOUNTER — Other Ambulatory Visit: Payer: Self-pay | Admitting: Internal Medicine

## 2015-04-10 ENCOUNTER — Telehealth: Payer: Self-pay | Admitting: Internal Medicine

## 2015-04-10 MED ORDER — SUMATRIPTAN SUCCINATE 100 MG PO TABS: ORAL_TABLET | ORAL | Status: DC

## 2015-04-10 NOTE — Telephone Encounter (Signed)
Hasn't been seen since 2015 and medication as not been filled since 2015. Please advise?

## 2015-04-10 NOTE — Telephone Encounter (Signed)
This was pending in another order, please advise if you will refill or needs to be seen?

## 2015-04-10 NOTE — Telephone Encounter (Signed)
Pt needs a refill on SUMAtriptan (IMITREX) 100 MG tablet.. Pt has not been seen since 09/04/13.. Told her that we would more than likely need to make an appt she stated that she will come in for an appt for the medication.Christy Fernandez

## 2015-04-10 NOTE — Telephone Encounter (Signed)
Can you offer her an appt for f/u.  Dr. Derrel Nip refilled. Thanks

## 2015-04-10 NOTE — Telephone Encounter (Signed)
Ok to refill for 30 days .  Please offer appt

## 2015-04-11 NOTE — Telephone Encounter (Signed)
Lm on vm for pt to schedule a follow up appt

## 2015-05-22 ENCOUNTER — Encounter: Payer: BLUE CROSS/BLUE SHIELD | Admitting: Internal Medicine

## 2015-05-24 ENCOUNTER — Encounter (INDEPENDENT_AMBULATORY_CARE_PROVIDER_SITE_OTHER): Payer: Self-pay

## 2015-05-24 ENCOUNTER — Ambulatory Visit (INDEPENDENT_AMBULATORY_CARE_PROVIDER_SITE_OTHER): Payer: BLUE CROSS/BLUE SHIELD | Admitting: Internal Medicine

## 2015-05-24 VITALS — BP 118/84 | HR 79 | Temp 98.1°F | Resp 12 | Ht 67.5 in | Wt 187.8 lb

## 2015-05-24 DIAGNOSIS — Z0001 Encounter for general adult medical examination with abnormal findings: Secondary | ICD-10-CM | POA: Diagnosis not present

## 2015-05-24 DIAGNOSIS — B9789 Other viral agents as the cause of diseases classified elsewhere: Secondary | ICD-10-CM

## 2015-05-24 DIAGNOSIS — G43109 Migraine with aura, not intractable, without status migrainosus: Secondary | ICD-10-CM | POA: Diagnosis not present

## 2015-05-24 DIAGNOSIS — Z7189 Other specified counseling: Secondary | ICD-10-CM | POA: Diagnosis not present

## 2015-05-24 DIAGNOSIS — IMO0002 Reserved for concepts with insufficient information to code with codable children: Secondary | ICD-10-CM

## 2015-05-24 DIAGNOSIS — J069 Acute upper respiratory infection, unspecified: Secondary | ICD-10-CM

## 2015-05-24 DIAGNOSIS — F410 Panic disorder [episodic paroxysmal anxiety] without agoraphobia: Secondary | ICD-10-CM | POA: Diagnosis not present

## 2015-05-24 DIAGNOSIS — Z0000 Encounter for general adult medical examination without abnormal findings: Secondary | ICD-10-CM

## 2015-05-24 MED ORDER — SPIRONOLACTONE 50 MG PO TABS: 50.0000 mg | ORAL_TABLET | Freq: Every day | ORAL | Status: DC

## 2015-05-24 MED ORDER — PROPRANOLOL HCL 10 MG PO TABS: ORAL_TABLET | ORAL | Status: DC

## 2015-05-24 MED ORDER — PREDNISONE 10 MG PO TABS: ORAL_TABLET | ORAL | Status: DC

## 2015-05-24 MED ORDER — METOPROLOL SUCCINATE ER 25 MG PO TB24: 25.0000 mg | ORAL_TABLET | Freq: Every day | ORAL | Status: DC

## 2015-05-24 MED ORDER — MOMETASONE FUROATE 50 MCG/ACT NA SUSP: 2.0000 | Freq: Every day | NASAL | Status: DC

## 2015-05-24 MED ORDER — HYDROCODONE-ACETAMINOPHEN 5-325 MG PO TABS: 1.0000 | ORAL_TABLET | Freq: Four times a day (QID) | ORAL | Status: DC | PRN

## 2015-05-24 MED ORDER — SUMATRIPTAN SUCCINATE 100 MG PO TABS: ORAL_TABLET | ORAL | Status: DC

## 2015-05-24 MED ORDER — LEVOFLOXACIN 500 MG PO TABS: 500.0000 mg | ORAL_TABLET | Freq: Every day | ORAL | Status: DC

## 2015-05-24 NOTE — Patient Instructions (Addendum)
Trial of Metoprolol Succinate (Toprol XL) at bedtime starting 10 days before each  period to prevent migraine  May not need to use propranolol during this time,  But if you feel  The stage fright/panic set in ,  Ok to use.   For your allergies ,  You can use Benadryl at night but you should also consider adding one of these newer second generation antihistamines that are longer acting, non sedating and  available OTC:  Generic  Zyrtec, which is cetirizine.    generic Allegra , available generically as fexofenadine ; comes in 60 mg and 180 mg once daily strengths.     Milta Deiters Med's sinus rinse used twice daily will help prevent sinus infections  Start the prednisone taper  Today or tomorrow given the dull ear drums   Save the vicodin and levaquin for migraine,  Cough or sinus infection

## 2015-05-24 NOTE — Progress Notes (Signed)
Pre-visit discussion using our clinic review tool. No additional management support is needed unless otherwise documented below in the visit note.  

## 2015-05-24 NOTE — Progress Notes (Signed)
Patient ID: Christy Fernandez Fernandez, female    DOB: 1970-05-27  Age: 45 y.o. MRN: [id number]  The patient is here for annual non gyn  wellness examination and management of other chronic and acute problems.  PAP normal 2015 Needs refsills on nasonex ,  Propranolol. .   took sudafed this morning and claritin fo rallergies    The risk factors are reflected in the social history.  The roster of all physicians providing medical care to patient - is listed in the Snapshot section of the chart.  Activities of daily living:  The patient is 100% independent in all ADLs: dressing, toileting, feeding as well as independent mobility  Home safety : The patient has smoke detectors in the home. They wear seatbelts.  There are no firearms at home. There is no violence in the home.   There is no risks for hepatitis, STDs or HIV. There is no   history of blood transfusion. They have no travel history to infectious disease endemic areas of the world.  The patient has seen their dentist in the last six month. They have seen their eye doctor in the last year. They admit to slight hearing difficulty with regard to whispered voices and some television programs.  They have deferred audiologic testing in the last year.  They do not  have excessive sun exposure. Discussed the need for sun protection: hats, long sleeves and use of sunscreen if there is significant sun exposure.   Diet: the importance of a healthy diet is discussed. They do have a healthy diet.  The benefits of regular aerobic exercise were discussed. She walks 4 times per week ,  20 minutes.   Depression screen: there are no signs or vegative symptoms of depression- irritability, change in appetite, anhedonia, sadness/tearfullness.  Cognitive assessment: the patient manages all their financial and personal affairs and is actively engaged. They could relate day,date,year and events; recalled 2/3 objects at 3 minutes; performed clock-face test normally.  The  following portions of the patient's history were reviewed and updated as appropriate: allergies, current medications, past family history, past medical history,  past surgical history, past social history  and problem list.  Visual acuity was not assessed per patient preference since she has regular follow up with her ophthalmologist. Hearing and body mass index were assessed and reviewed.   During the course of the visit the patient was educated and counseled about appropriate screening and preventive services including : fall prevention , diabetes screening, nutrition counseling, colorectal cancer screening, and recommended immunizations.    CC: The primary encounter diagnosis was Migraine with aura and without status migrainosus, not intractable. Diagnoses of Panic disorder, Routine general medical examination at a health care facility, Advice or immunization for travel, and Viral URI with cough were also pertinent to this visit.  Congestion and ear fullness  Present for the lats 3 days.  Flying to [place] in 2 days.   Had a severe migraine in Feb and was out of imitrex.    MIGRAINES HAVE BECOME MONTHLY FOR THE LAST 5 MONTHS one week before the period starts . Lasting 3 to 4 days    [name] Fernandez has a past medical history of Migraine.   She has past surgical history that includes septoplasty and LEEP (2002).   Her family history includes Cancer in her maternal grandfather, maternal grandmother, and paternal grandmother; Diabetes in her paternal grandfather.She reports that she has never smoked. She has never used smokeless tobacco. She reports that she drinks  alcohol. She reports that she does not use illicit drugs.  Outpatient Prescriptions Prior to Visit  Medication Sig Dispense Refill  . Multiple Vitamin (MULTIVITAMIN) tablet Take 1 tablet by mouth daily.    . SUMAtriptan (IMITREX) 100 MG tablet Take 1 tablet (100 mg total) by mouth once. May repeat in 2 hours if headache persists or  recurs. 9 tablet 0  . mometasone (NASONEX) 50 MCG/ACT nasal spray Place 2 sprays into the nose daily.    . propranolol (INDERAL) 10 MG tablet 1 or 2 tablets as needed for anxiety 30 tablet 1  . SUMAtriptan (IMITREX) 100 MG tablet TAKE 1 TABLET BY MOUTH EVERY 2 HOURS AS NEEDED FOR MIGRAINE 10 tablet 0  . ergocalciferol (DRISDOL) 50000 UNITS capsule Take 1 capsule (50,000 Units total) by mouth once a week. (Patient not taking: Reported on 05/24/2015) 4 capsule 0  . spironolactone (ALDACTONE) 50 MG tablet Take 1 tablet (50 mg total) by mouth daily. As needed for fluid retention 30 tablet 3   No facility-administered medications prior to visit.    Review of Systems   Patient denies headache, fevers, malaise, unintentional weight loss, skin rash, eye pain, sinus congestion and sinus pain, sore throat, dysphagia,  hemoptysis , cough, dyspnea, wheezing, chest pain, palpitations, orthopnea, edema, abdominal pain, nausea, melena, diarrhea, constipation, flank pain, dysuria, hematuria, urinary  Frequency, nocturia, numbness, tingling, seizures,  Focal weakness, Loss of consciousness,  Tremor, insomnia, depression, anxiety, and suicidal ideation.      Objective:  BP 118/84 mmHg  Pulse 79  Temp(Src) 98.1 F (36.7 C) (Oral)  Resp 12  Ht 5' 7.5" (1.715 m)  Wt 187 lb 12 oz (85.163 kg)  BMI 28.95 kg/m2  SpO2 100%  LMP 05/08/2015 (Approximate)  Physical Exam   General appearance: alert, cooperative and appears stated age Head: Normocephalic, without obvious abnormality, atraumatic Eyes: conjunctivae/corneas clear. PERRL, EOM's intact. Fundi benign. Ears: normal TM's and external ear canals both ears Nose: Nares normal. Septum midline. Mucosa normal. No drainage or sinus tenderness. Throat: lips, mucosa, and tongue normal; teeth and gums normal Neck: no adenopathy, no carotid bruit, no JVD, supple, symmetrical, trachea midline and thyroid not enlarged, symmetric, no tenderness/mass/nodules Lungs:  clear to auscultation bilaterally Breasts: normal appearance, no masses or tenderness Heart: regular rate and rhythm, S1, S2 normal, no murmur, click, rub or gallop Abdomen: soft, non-tender; bowel sounds normal; no masses,  no organomegaly Extremities: extremities normal, atraumatic, no cyanosis or edema Pulses: 2+ and symmetric Skin: Skin color, texture, turgor normal. No rashes or lesions Neurologic: Alert and oriented X 3, normal strength and tone. Normal symmetric reflexes. Normal coordination and gait.     Assessment & Plan:   Problem List Items Addressed This Visit    Migraine - Primary    Occurring monthly the week prior to her menses.  Trial of Toprol XL at bedtime  For two weeks every month       Relevant Medications   SUMAtriptan (IMITREX) 100 MG tablet   metoprolol succinate (TOPROL-XL) 25 MG 24 hr tablet   HYDROcodone-acetaminophen (NORCO/VICODIN) 5-325 MG tablet   spironolactone (ALDACTONE) 50 MG tablet   propranolol (INDERAL) 10 MG tablet   Panic disorder    Limited to givne oral presentations.  Continue prn inderal       Routine general medical examination at a health care facility    Annual comprehensive preventive exam was done as well as an evaluation and management of chronic conditions .  During the course of  the visit the patient was educated and counseled about appropriate screening and preventive services including :  diabetes screening, lipid analysis with projected  10 year  risk for CAD , nutrition counseling, breast, cervical and colorectal cancer screening, and recommended immunizations.  Printed recommendations for health maintenance screenings was give      Advice or immunization for travel    Patient was given   rx for Levaquin and prednisone taper  with instructions on when to use, and protective masks to use on flight if indicated.         Viral URI with cough    URI is most likely viral given the mild HEENT  Symptoms  And normal exam.     Continue oral and nasal decongestants, add prednisone taper for inflammation.  Advised to start abx only if  Unilateral ear or sinus pain develops,  Or patient develops T > 101 or symptoms last > 7 days.  Probiotics also recommended.           I have discontinued Ms. Fernandez's ergocalciferol. I am also having her start on metoprolol succinate, levofloxacin, predniSONE, and HYDROcodone-acetaminophen. Additionally, I am having her maintain her multivitamin, SUMAtriptan, SUMAtriptan, mometasone, spironolactone, and propranolol.  Meds ordered this encounter  Medications  . SUMAtriptan (IMITREX) 100 MG tablet    Sig: TAKE 1 TABLET BY MOUTH EVERY 2 HOURS AS NEEDED FOR MIGRAINE    Dispense:  10 tablet    Refill:  11    KEEP ON FILE FOR FUTURE REFILLS  . metoprolol succinate (TOPROL-XL) 25 MG 24 hr tablet    Sig: Take 1 tablet (25 mg total) by mouth daily.    Dispense:  30 tablet    Refill:  3  . levofloxacin (LEVAQUIN) 500 MG tablet    Sig: Take 1 tablet (500 mg total) by mouth daily.    Dispense:  7 tablet    Refill:  0  . predniSONE (DELTASONE) 10 MG tablet    Sig: 6 tablets on Day 1 , then reduce by 1 tablet daily until gone    Dispense:  21 tablet    Refill:  0  . HYDROcodone-acetaminophen (NORCO/VICODIN) 5-325 MG tablet    Sig: Take 1 tablet by mouth every 6 (six) hours as needed for moderate pain. Or cough    Dispense:  30 tablet    Refill:  0  . mometasone (NASONEX) 50 MCG/ACT nasal spray    Sig: Place 2 sprays into the nose daily.    Dispense:  17 g    Refill:  11  . spironolactone (ALDACTONE) 50 MG tablet    Sig: Take 1 tablet (50 mg total) by mouth daily. As needed for fluid retention    Dispense:  30 tablet    Refill:  3  . propranolol (INDERAL) 10 MG tablet    Sig: 1 or 2 tablets as needed for anxiety    Dispense:  30 tablet    Refill:  1    Medications Discontinued During This Encounter  Medication Reason  . ergocalciferol (DRISDOL) 50000 UNITS capsule Completed  Course  . SUMAtriptan (IMITREX) 100 MG tablet Reorder  . mometasone (NASONEX) 50 MCG/ACT nasal spray Reorder  . spironolactone (ALDACTONE) 50 MG tablet Reorder  . propranolol (INDERAL) 10 MG tablet Reorder    Follow-up: No Follow-up on file.   Crecencio Mc, MD

## 2015-05-25 DIAGNOSIS — J069 Acute upper respiratory infection, unspecified: Secondary | ICD-10-CM | POA: Insufficient documentation

## 2015-05-25 DIAGNOSIS — B9789 Other viral agents as the cause of diseases classified elsewhere: Secondary | ICD-10-CM

## 2015-05-25 NOTE — Assessment & Plan Note (Signed)
Limited to givne oral presentations.  Continue prn inderal

## 2015-05-25 NOTE — Assessment & Plan Note (Signed)
URI is most likely viral given the mild HEENT  Symptoms  And normal exam.    Continue oral and nasal decongestants, add prednisone taper for inflammation.  Advised to start abx only if  Unilateral ear or sinus pain develops,  Or patient develops T > 101 or symptoms last > 7 days.  Probiotics also recommended.

## 2015-05-25 NOTE — Assessment & Plan Note (Signed)
Annual comprehensive preventive exam was done as well as an evaluation and management of chronic conditions .  During the course of the visit the patient was educated and counseled about appropriate screening and preventive services including :  diabetes screening, lipid analysis with projected  10 year  risk for CAD , nutrition counseling, breast, cervical and colorectal cancer screening, and recommended immunizations.  Printed recommendations for health maintenance screenings was give 

## 2015-05-25 NOTE — Assessment & Plan Note (Signed)
Occurring monthly the week prior to her menses.  Trial of Toprol XL at bedtime  For two weeks every month

## 2015-05-25 NOTE — Assessment & Plan Note (Signed)
Patient was given   rx for Levaquin and prednisone taper  with instructions on when to use, and protective masks to use on flight if indicated.

## 2015-10-05 DIAGNOSIS — M9901 Segmental and somatic dysfunction of cervical region: Secondary | ICD-10-CM | POA: Diagnosis not present

## 2015-10-05 DIAGNOSIS — M9903 Segmental and somatic dysfunction of lumbar region: Secondary | ICD-10-CM | POA: Diagnosis not present

## 2015-10-05 DIAGNOSIS — M9902 Segmental and somatic dysfunction of thoracic region: Secondary | ICD-10-CM | POA: Diagnosis not present

## 2015-11-19 LAB — LIPID PANEL
CHOLESTEROL: 185 mg/dL (ref 0–200)
HDL: 58 mg/dL (ref 35–70)
LDL Cholesterol: 38 mg/dL
TRIGLYCERIDES: 190 mg/dL — AB (ref 40–160)

## 2015-11-19 LAB — CBC AND DIFFERENTIAL
HEMATOCRIT: 39 % (ref 36–46)
HEMOGLOBIN: 12.9 g/dL (ref 12.0–16.0)
NEUTROS ABS: 3 /uL
Platelets: 357 10*3/uL (ref 150–399)
WBC: 5.5 10^3/mL

## 2015-11-19 LAB — HEPATIC FUNCTION PANEL
ALT: 17 U/L (ref 7–35)
AST: 21 U/L (ref 13–35)
Alkaline Phosphatase: 45 U/L (ref 25–125)

## 2015-11-19 LAB — BASIC METABOLIC PANEL
BUN: 10 mg/dL (ref 4–21)
CREATININE: 0.7 mg/dL (ref 0.5–1.1)
Glucose: 100 mg/dL
POTASSIUM: 4.9 mmol/L (ref 3.4–5.3)
SODIUM: 141 mmol/L (ref 137–147)

## 2015-11-19 LAB — TSH: TSH: 2.88 u[IU]/mL (ref 0.41–5.90)

## 2015-11-19 LAB — VITAMIN D 25 HYDROXY (VIT D DEFICIENCY, FRACTURES): VIT D 25 HYDROXY: 26.1

## 2016-05-25 ENCOUNTER — Ambulatory Visit (INDEPENDENT_AMBULATORY_CARE_PROVIDER_SITE_OTHER): Payer: BLUE CROSS/BLUE SHIELD | Admitting: Internal Medicine

## 2016-05-25 ENCOUNTER — Other Ambulatory Visit: Payer: Self-pay | Admitting: Internal Medicine

## 2016-05-25 ENCOUNTER — Other Ambulatory Visit (HOSPITAL_COMMUNITY)
Admission: RE | Admit: 2016-05-25 | Discharge: 2016-05-25 | Disposition: A | Discharge status: Home / Self Care | Payer: BLUE CROSS/BLUE SHIELD | Source: Ambulatory Visit | Attending: Internal Medicine | Admitting: Internal Medicine

## 2016-05-25 ENCOUNTER — Encounter: Payer: Self-pay | Admitting: Internal Medicine

## 2016-05-25 VITALS — BP 118/84 | HR 80 | Temp 98.1°F | Resp 16 | Ht 68.0 in | Wt 193.0 lb

## 2016-05-25 DIAGNOSIS — Z1151 Encounter for screening for human papillomavirus (HPV): Secondary | ICD-10-CM | POA: Diagnosis not present

## 2016-05-25 DIAGNOSIS — Z809 Family history of malignant neoplasm, unspecified: Secondary | ICD-10-CM

## 2016-05-25 DIAGNOSIS — Z01419 Encounter for gynecological examination (general) (routine) without abnormal findings: Secondary | ICD-10-CM | POA: Insufficient documentation

## 2016-05-25 DIAGNOSIS — Z1239 Encounter for other screening for malignant neoplasm of breast: Secondary | ICD-10-CM

## 2016-05-25 DIAGNOSIS — Z124 Encounter for screening for malignant neoplasm of cervix: Secondary | ICD-10-CM | POA: Diagnosis not present

## 2016-05-25 DIAGNOSIS — Z0000 Encounter for general adult medical examination without abnormal findings: Secondary | ICD-10-CM | POA: Diagnosis not present

## 2016-05-25 DIAGNOSIS — Z1231 Encounter for screening mammogram for malignant neoplasm of breast: Secondary | ICD-10-CM

## 2016-05-25 NOTE — Patient Instructions (Addendum)
Please ask Tami to send me the last few years of labs.    We did your PAP smear and I have ordered your mammogram   Health Maintenance, Female Adopting a healthy lifestyle and getting preventive care can go a long way to promote health and wellness. Talk with your health care provider about what schedule of regular examinations is right for you. This is a good chance for you to check in with your provider about disease prevention and staying healthy. In between checkups, there are plenty of things you can do on your own. Experts have done a lot of research about which lifestyle changes and preventive measures are most likely to keep you healthy. Ask your health care provider for more information. Weight and diet Eat a healthy diet  Be sure to include plenty of vegetables, fruits, low-fat dairy products, and lean protein.  Do not eat a lot of foods high in solid fats, added sugars, or salt.  Get regular exercise. This is one of the most important things you can do for your health.  Most adults should exercise for at least 150 minutes each week. The exercise should increase your heart rate and make you sweat (moderate-intensity exercise).  Most adults should also do strengthening exercises at least twice a week. This is in addition to the moderate-intensity exercise. Maintain a healthy weight  Body mass index (BMI) is a measurement that can be used to identify possible weight problems. It estimates body fat based on height and weight. Your health care provider can help determine your BMI and help you achieve or maintain a healthy weight.  For females 13 years of age and older:  A BMI below 18.5 is considered underweight.  A BMI of 18.5 to 24.9 is normal.  A BMI of 25 to 29.9 is considered overweight.  A BMI of 30 and above is considered obese. Watch levels of cholesterol and blood lipids  You should start having your blood tested for lipids and cholesterol at 45 years of age, then  have this test every 5 years.  You may need to have your cholesterol levels checked more often if:  Your lipid or cholesterol levels are high.  You are older than 45 years of age.  You are at high risk for heart disease. Cancer screening Lung Cancer  Lung cancer screening is recommended for adults 40-78 years old who are at high risk for lung cancer because of a history of smoking.  A yearly low-dose CT scan of the lungs is recommended for people who:  Currently smoke.  Have quit within the past 15 years.  Have at least a 30-pack-year history of smoking. A pack year is smoking an average of one pack of cigarettes a day for 1 year.  Yearly screening should continue until it has been 15 years since you quit.  Yearly screening should stop if you develop a health problem that would prevent you from having lung cancer treatment. Breast Cancer  Practice breast self-awareness. This means understanding how your breasts normally appear and feel.  It also means doing regular breast self-exams. Let your health care provider know about any changes, no matter how small.  If you are in your 20s or 30s, you should have a clinical breast exam (CBE) by a health care provider every 1-3 years as part of a regular health exam.  If you are 35 or older, have a CBE every year. Also consider having a breast X-ray (mammogram) every year.  If  you have a family history of breast cancer, talk to your health care provider about genetic screening.  If you are at high risk for breast cancer, talk to your health care provider about having an MRI and a mammogram every year.  Breast cancer gene (BRCA) assessment is recommended for women who have family members with BRCA-related cancers. BRCA-related cancers include:  Breast.  Ovarian.  Tubal.  Peritoneal cancers.  Results of the assessment will determine the need for genetic counseling and BRCA1 and BRCA2 testing. Cervical Cancer  Your health care  provider may recommend that you be screened regularly for cancer of the pelvic organs (ovaries, uterus, and vagina). This screening involves a pelvic examination, including checking for microscopic changes to the surface of your cervix (Pap test). You may be encouraged to have this screening done every 3 years, beginning at age 68.  For women ages 39-65, health care providers may recommend pelvic exams and Pap testing every 3 years, or they may recommend the Pap and pelvic exam, combined with testing for human papilloma virus (HPV), every 5 years. Some types of HPV increase your risk of cervical cancer. Testing for HPV may also be done on women of any age with unclear Pap test results.  Other health care providers may not recommend any screening for nonpregnant women who are considered low risk for pelvic cancer and who do not have symptoms. Ask your health care provider if a screening pelvic exam is right for you.  If you have had past treatment for cervical cancer or a condition that could lead to cancer, you need Pap tests and screening for cancer for at least 20 years after your treatment. If Pap tests have been discontinued, your risk factors (such as having a new sexual partner) need to be reassessed to determine if screening should resume. Some women have medical problems that increase the chance of getting cervical cancer. In these cases, your health care provider may recommend more frequent screening and Pap tests. Colorectal Cancer  This type of cancer can be detected and often prevented.  Routine colorectal cancer screening usually begins at 46 years of age and continues through 45 years of age.  Your health care provider may recommend screening at an earlier age if you have risk factors for colon cancer.  Your health care provider may also recommend using home test kits to check for hidden blood in the stool.  A small camera at the end of a tube can be used to examine your colon directly  (sigmoidoscopy or colonoscopy). This is done to check for the earliest forms of colorectal cancer.  Routine screening usually begins at age 63.  Direct examination of the colon should be repeated every 5-10 years through 45 years of age. However, you may need to be screened more often if early forms of precancerous polyps or small growths are found. Skin Cancer  Check your skin from head to toe regularly.  Tell your health care provider about any new moles or changes in moles, especially if there is a change in a mole's shape or color.  Also tell your health care provider if you have a mole that is larger than the size of a pencil eraser.  Always use sunscreen. Apply sunscreen liberally and repeatedly throughout the day.  Protect yourself by wearing long sleeves, pants, a wide-brimmed hat, and sunglasses whenever you are outside. Heart disease, diabetes, and high blood pressure  High blood pressure causes heart disease and increases the risk of  stroke. High blood pressure is more likely to develop in:  People who have blood pressure in the high end of the normal range (130-139/85-89 mm Hg).  People who are overweight or obese.  People who are African American.  If you are 4-55 years of age, have your blood pressure checked every 3-5 years. If you are 80 years of age or older, have your blood pressure checked every year. You should have your blood pressure measured twice-once when you are at a hospital or clinic, and once when you are not at a hospital or clinic. Record the average of the two measurements. To check your blood pressure when you are not at a hospital or clinic, you can use:  An automated blood pressure machine at a pharmacy.  A home blood pressure monitor.  If you are between 74 years and 73 years old, ask your health care provider if you should take aspirin to prevent strokes.  Have regular diabetes screenings. This involves taking a blood sample to check your  fasting blood sugar level.  If you are at a normal weight and have a low risk for diabetes, have this test once every three years after 45 years of age.  If you are overweight and have a high risk for diabetes, consider being tested at a younger age or more often. Preventing infection Hepatitis B  If you have a higher risk for hepatitis B, you should be screened for this virus. You are considered at high risk for hepatitis B if:  You were born in a country where hepatitis B is common. Ask your health care provider which countries are considered high risk.  Your parents were born in a high-risk country, and you have not been immunized against hepatitis B (hepatitis B vaccine).  You have HIV or AIDS.  You use needles to inject street drugs.  You live with someone who has hepatitis B.  You have had sex with someone who has hepatitis B.  You get hemodialysis treatment.  You take certain medicines for conditions, including cancer, organ transplantation, and autoimmune conditions. Hepatitis C  Blood testing is recommended for:  Everyone born from [birth year] through 1964.  Anyone with known risk factors for hepatitis C. Sexually transmitted infections (STIs)  You should be screened for sexually transmitted infections (STIs) including gonorrhea and chlamydia if:  You are sexually active and are younger than 45 years of age.  You are older than 45 years of age and your health care provider tells you that you are at risk for this type of infection.  Your sexual activity has changed since you were last screened and you are at an increased risk for chlamydia or gonorrhea. Ask your health care provider if you are at risk.  If you do not have HIV, but are at risk, it may be recommended that you take a prescription medicine daily to prevent HIV infection. This is called pre-exposure prophylaxis (PrEP). You are considered at risk if:  You are sexually active and do not regularly use condoms or  know the HIV status of your partner(s).  You take drugs by injection.  You are sexually active with a partner who has HIV. Talk with your health care provider about whether you are at high risk of being infected with HIV. If you choose to begin PrEP, you should first be tested for HIV. You should then be tested every 3 months for as long as you are taking PrEP. Pregnancy  If you are premenopausal  and you may become pregnant, ask your health care provider about preconception counseling.  If you may become pregnant, take 400 to 800 micrograms (mcg) of folic acid every day.  If you want to prevent pregnancy, talk to your health care provider about birth control (contraception). Osteoporosis and menopause  Osteoporosis is a disease in which the bones lose minerals and strength with aging. This can result in serious bone fractures. Your risk for osteoporosis can be identified using a bone density scan.  If you are 24 years of age or older, or if you are at risk for osteoporosis and fractures, ask your health care provider if you should be screened.  Ask your health care provider whether you should take a calcium or vitamin D supplement to lower your risk for osteoporosis.  Menopause may have certain physical symptoms and risks.  Hormone replacement therapy may reduce some of these symptoms and risks. Talk to your health care provider about whether hormone replacement therapy is right for you. Follow these instructions at home:  Schedule regular health, dental, and eye exams.  Stay current with your immunizations.  Do not use any tobacco products including cigarettes, chewing tobacco, or electronic cigarettes.  If you are pregnant, do not drink alcohol.  If you are breastfeeding, limit how much and how often you drink alcohol.  Limit alcohol intake to no more than 1 drink per day for nonpregnant women. One drink equals 12 ounces of beer, 5 ounces of wine, or 1 ounces of hard  liquor.  Do not use street drugs.  Do not share needles.  Ask your health care provider for help if you need support or information about quitting drugs.  Tell your health care provider if you often feel depressed.  Tell your health care provider if you have ever been abused or do not feel safe at home. This information is not intended to replace advice given to you by your health care provider. Make sure you discuss any questions you have with your health care provider. Document Released: 08/25/2010 Document Revised: 07/18/2015 Document Reviewed: 11/13/2014 Elsevier Interactive Patient Education  2017 [employer].

## 2016-05-25 NOTE — Progress Notes (Signed)
Patient ID: Christy Fernandez, female    DOB: 1970/02/26  Age: 46 y.o. MRN: [id number]  The patient is here for annual preventive exmination and management of other chronic and acute problems.  Last seen one year ago Had a  comprehensive panel in September from [place]     paternal GM had brca.  Mom is fine   maternal granparents both  had colon Ca  .  Mother again fine   Wants to know if she needs BRCA testing   Due for PAP  The risk factors are reflected in the social history.  The roster of all physicians providing medical care to patient - is listed in the Snapshot section of the chart.  Activities of daily living:  The patient is 100% independent in all ADLs: dressing, toileting, feeding as well as independent mobility  Home safety : The patient has smoke detectors in the home. They wear seatbelts.  There are no firearms at home. There is no violence in the home.   There is no risks for hepatitis, STDs or HIV. There is no   history of blood transfusion. They have no travel history to infectious disease endemic areas of the world.  The patient has seen their dentist in the last six month. They have seen their eye doctor in the last year. They admit to slight hearing difficulty with regard to whispered voices and some television programs.  They have deferred audiologic testing in the last year.  They do not  have excessive sun exposure. Discussed the need for sun protection: hats, long sleeves and use of sunscreen if there is significant sun exposure.   Diet: the importance of a healthy diet is discussed. They do have a healthy diet.  The benefits of regular aerobic exercise were discussed. She walks 4 times per week ,  20 minutes.   Depression screen: there are no signs or vegative symptoms of depression- irritability, change in appetite, anhedonia, sadness/tearfullness.  Cognitive assessment: the patient manages all their financial and personal affairs and is actively engaged. They  could relate day,date,year and events; recalled 2/3 objects at 3 minutes; performed clock-face test normally.  The following portions of the patient's history were reviewed and updated as appropriate: allergies, current medications, past family history, past medical history,  past surgical history, past social history  and problem list.  Visual acuity was not assessed per patient preference since she has regular follow up with her ophthalmologist. Hearing and body mass index were assessed and reviewed.   During the course of the visit the patient was educated and counseled about appropriate screening and preventive services including : fall prevention , diabetes screening, nutrition counseling, colorectal cancer screening, and recommended immunizations.    CC: The primary encounter diagnosis was Breast cancer screening. Diagnoses of Cervical cancer screening, Routine general medical examination at a health care facility, and Family history of cancer were also pertinent to this visit.  [name] Fernandez has a past medical history of Migraine.   She has a past surgical history that includes septoplasty and LEEP (2002).   Her family history includes Cancer in her maternal grandfather, maternal grandmother, and paternal grandmother; Diabetes in her paternal grandfather.She reports that she has never smoked. She has never used smokeless tobacco. She reports that she drinks alcohol. She reports that she does not use drugs.  Outpatient Medications Prior to Visit  Medication Sig Dispense Refill  . metoprolol succinate (TOPROL-XL) 25 MG 24 hr tablet Take 1 tablet (25 mg total)  by mouth daily. 30 tablet 3  . mometasone (NASONEX) 50 MCG/ACT nasal spray Place 2 sprays into the nose daily. 17 g 11  . Multiple Vitamin (MULTIVITAMIN) tablet Take 1 tablet by mouth daily.    . propranolol (INDERAL) 10 MG tablet 1 or 2 tablets as needed for anxiety 30 tablet 1  . spironolactone (ALDACTONE) 50 MG tablet Take 1  tablet (50 mg total) by mouth daily. As needed for fluid retention 30 tablet 3  . SUMAtriptan (IMITREX) 100 MG tablet Take 1 tablet (100 mg total) by mouth once. May repeat in 2 hours if headache persists or recurs. 9 tablet 0  . SUMAtriptan (IMITREX) 100 MG tablet TAKE 1 TABLET BY MOUTH EVERY 2 HOURS AS NEEDED FOR MIGRAINE 10 tablet 11  . HYDROcodone-acetaminophen (NORCO/VICODIN) 5-325 MG tablet Take 1 tablet by mouth every 6 (six) hours as needed for moderate pain. Or cough (Patient not taking: Reported on 05/25/2016) 30 tablet 0  . levofloxacin (LEVAQUIN) 500 MG tablet Take 1 tablet (500 mg total) by mouth daily. (Patient not taking: Reported on 05/25/2016) 7 tablet 0  . predniSONE (DELTASONE) 10 MG tablet 6 tablets on Day 1 , then reduce by 1 tablet daily until gone (Patient not taking: Reported on 05/25/2016) 21 tablet 0   No facility-administered medications prior to visit.     Review of Systems   Patient denies headache, fevers, malaise, unintentional weight loss, skin rash, eye pain, sinus congestion and sinus pain, sore throat, dysphagia,  hemoptysis , cough, dyspnea, wheezing, chest pain, palpitations, orthopnea, edema, abdominal pain, nausea, melena, diarrhea, constipation, flank pain, dysuria, hematuria, urinary  Frequency, nocturia, numbness, tingling, seizures,  Focal weakness, Loss of consciousness,  Tremor, insomnia, depression, anxiety, and suicidal ideation.      Objective:  BP 118/84 (BP Location: Left Arm, Patient Position: Sitting, Cuff Size: Normal)   Pulse 80   Temp 98.1 F (36.7 C) (Oral)   Resp 16   Ht 5' 8"  (1.727 m)   Wt 193 lb (87.5 kg)   SpO2 98%   BMI 29.35 kg/m   Physical Exam   . General Appearance:    Alert, cooperative, no distress, appears stated age  Head:    Normocephalic, without obvious abnormality, atraumatic  Eyes:    PERRL, conjunctiva/corneas clear, EOM's intact, fundi    benign, both eyes  Ears:    Normal TM's and external ear canals, both ears   Nose:   Nares normal, septum midline, mucosa normal, no drainage    or sinus tenderness  Throat:   Lips, mucosa, and tongue normal; teeth and gums normal  Neck:   Supple, symmetrical, trachea midline, no adenopathy;    thyroid:  no enlargement/tenderness/nodules; no carotid   bruit or JVD  Back:     Symmetric, no curvature, ROM normal, no CVA tenderness  Lungs:     Clear to auscultation bilaterally, respirations unlabored  Chest Wall:    No tenderness or deformity   Heart:    Regular rate and rhythm, S1 and S2 normal, no murmur, rub   or gallop  Breast Exam:    No tenderness, masses, or nipple abnormality  Abdomen:     Soft, non-tender, bowel sounds active all four quadrants,    no masses, no organomegaly  Genitalia:    Pelvic: cervix normal in appearance, external genitalia normal, no adnexal masses or tenderness, no cervical motion tenderness, rectovaginal septum normal, uterus normal size, shape, and consistency and vagina normal without discharge  Extremities:  Extremities normal, atraumatic, no cyanosis or edema  Pulses:   2+ and symmetric all extremities  Skin:   Skin color, texture, turgor normal, no rashes or lesions  Lymph nodes:   Cervical, supraclavicular, and axillary nodes normal  Neurologic:   CNII-XII intact, normal strength, sensation and reflexes    throughout      Assessment & Plan:   Problem List Items Addressed This Visit    Cervical cancer screening    PAP smear was done today       Relevant Orders   Cytology - PAP (Completed)   Family history of cancer    She is inquiring about getting genetic testing for BRCA 1 .  She has no personal history of cancer so testing is not recommended .      Routine general medical examination at a health care facility    Annual comprehensive preventive exam was done as well as an evaluation and management of chronic conditions .  During the course of the visit the patient was educated and counseled about appropriate  screening and preventive services including :  diabetes screening, lipid analysis with projected  10 year  risk for CAD , nutrition counseling, breast, cervical and colorectal cancer screening, and recommended immunizations.  Printed recommendations for health maintenance screenings was given       Other Visit Diagnoses    Breast cancer screening    -  Primary      I am having Ms. Carlota Raspberry maintain her multivitamin, SUMAtriptan, SUMAtriptan, metoprolol succinate, levofloxacin, predniSONE, HYDROcodone-acetaminophen, mometasone, spironolactone, and propranolol.  No orders of the defined types were placed in this encounter.   There are no discontinued medications.  Follow-up: No Follow-up on file.   Crecencio Mc, MD

## 2016-05-25 NOTE — Progress Notes (Signed)
Pre visit review using our clinic review tool, if applicable. No additional management support is needed unless otherwise documented below in the visit note. 

## 2016-05-26 DIAGNOSIS — Z809 Family history of malignant neoplasm, unspecified: Secondary | ICD-10-CM | POA: Insufficient documentation

## 2016-05-26 LAB — CYTOLOGY - PAP
Diagnosis: NEGATIVE
HPV: NOT DETECTED

## 2016-05-26 NOTE — Assessment & Plan Note (Signed)
PAP smear was done today 

## 2016-05-26 NOTE — Assessment & Plan Note (Signed)
Annual comprehensive preventive exam was done as well as an evaluation and management of chronic conditions .  During the course of the visit the patient was educated and counseled about appropriate screening and preventive services including :  diabetes screening, lipid analysis with projected  10 year  risk for CAD , nutrition counseling, breast, cervical and colorectal cancer screening, and recommended immunizations.  Printed recommendations for health maintenance screenings was given 

## 2016-05-26 NOTE — Assessment & Plan Note (Signed)
She is inquiring about getting genetic testing for BRCA 1 .  She has no personal history of cancer so testing is not recommended .

## 2016-05-28 ENCOUNTER — Other Ambulatory Visit: Payer: Self-pay

## 2016-05-29 ENCOUNTER — Other Ambulatory Visit: Payer: Self-pay

## 2016-06-16 ENCOUNTER — Ambulatory Visit
Admission: RE | Admit: 2016-06-16 | Discharge: 2016-06-16 | Disposition: A | Discharge status: Home / Self Care | Payer: BLUE CROSS/BLUE SHIELD | Source: Ambulatory Visit | Attending: Internal Medicine | Admitting: Internal Medicine

## 2016-06-16 DIAGNOSIS — Z1231 Encounter for screening mammogram for malignant neoplasm of breast: Secondary | ICD-10-CM

## 2016-07-03 DIAGNOSIS — L738 Other specified follicular disorders: Secondary | ICD-10-CM | POA: Diagnosis not present

## 2016-07-06 ENCOUNTER — Other Ambulatory Visit: Payer: Self-pay | Admitting: Internal Medicine

## 2016-09-26 ENCOUNTER — Other Ambulatory Visit: Payer: Self-pay | Admitting: Internal Medicine

## 2016-11-04 LAB — BASIC METABOLIC PANEL
BUN: 13 (ref 4–21)
CREATININE: 0.8 (ref 0.5–1.1)
Glucose: 101
POTASSIUM: 4.1 (ref 3.4–5.3)
SODIUM: 137 (ref 137–147)

## 2016-11-04 LAB — HEPATIC FUNCTION PANEL
ALT: 17 (ref 7–35)
AST: 22 (ref 13–35)
Alkaline Phosphatase: 45 (ref 25–125)
Bilirubin, Total: 0.5

## 2016-11-04 LAB — LIPID PANEL
Cholesterol: 190 (ref 0–200)
HDL: 47 (ref 35–70)
LDL Cholesterol: 94
TRIGLYCERIDES: 247 — AB (ref 40–160)

## 2016-11-04 LAB — CBC AND DIFFERENTIAL
HEMATOCRIT: 36 (ref 36–46)
HEMOGLOBIN: 11.7 — AB (ref 12.0–16.0)
NEUTROS ABS: 2
Platelets: 312 (ref 150–399)
WBC: 3.9

## 2016-11-04 LAB — HEMOGLOBIN A1C: Hemoglobin A1C: 4.9

## 2016-11-04 LAB — VITAMIN D 25 HYDROXY (VIT D DEFICIENCY, FRACTURES): Vit D, 25-Hydroxy: 24.4

## 2016-11-04 LAB — TSH: TSH: 5.2 (ref 0.41–5.90)

## 2016-11-06 ENCOUNTER — Telehealth: Payer: Self-pay | Admitting: Internal Medicine

## 2016-11-06 NOTE — Telephone Encounter (Signed)
Left message for patient to return call back.  

## 2016-11-06 NOTE — Telephone Encounter (Signed)
Received a1c from [facility].  a1c of 4.9 means she has no increased risk of diabetes.

## 2016-11-08 ENCOUNTER — Telehealth: Payer: Self-pay | Admitting: Internal Medicine

## 2016-11-08 NOTE — Telephone Encounter (Signed)
Replaces  prior message about a1c  Your CBC,  thyroid , liver and kidney function are normal.  You are not  diabetic or prediabetic .  Your cholesterol is notable for Triglycerides which are mildly are elevated  And should improve with a low GI diet and exercise   Your vitamin D is slightly low.  If you are not taking a D3 supplement,  please start taking 1000 [place] daily.  If you are ,  Please increase to 2000 IUs daily. Low Vitamin D can increase your risk of weak bones and fractures and interfere with your body's ability to absorb the calcium in your diet.

## 2016-11-09 NOTE — Telephone Encounter (Signed)
Spoke with pt and informed her of her a1c results. Pt gave a verbal understanding.

## 2016-11-09 NOTE — Telephone Encounter (Signed)
Spoke with pt and informed her of her lab results. Pt gave a verbal understanding.  

## 2016-11-10 ENCOUNTER — Other Ambulatory Visit: Payer: Self-pay

## 2016-12-29 ENCOUNTER — Ambulatory Visit: Payer: Self-pay | Admitting: *Deleted

## 2016-12-29 DIAGNOSIS — R05 Cough: Secondary | ICD-10-CM

## 2016-12-29 DIAGNOSIS — R059 Cough, unspecified: Secondary | ICD-10-CM

## 2016-12-29 NOTE — Telephone Encounter (Addendum)
Attempted to reach pt via phone regarding her cough; unable to make contact; left message on voice mail  1000: Contacted pt by phone; previously diagnosed with Lyme disease and treated with antibiotics; Now have been treating cough for 1 month; had course of antibiotics finishing 2nd course (Amoxicillan), prednisone, and pro-air albuterol inhaler; pt started antibiotics on Friday 12/25/16 and she "was initaitally feeling better, but now feels like she's square one"; Pt also has sinus pain and congestion; cough inititally felt like "chest" cough occassional productive thick and yellow; now dry hacking cough; pt feels like she is not getting better despite long course of treatment; would like to see Dr Derrel Nip ASAP but will consider seeing a provider in that office; Will route to MD's office and call pt back with information

## 2016-12-29 NOTE — Telephone Encounter (Signed)
Attempted to reach pt via phone regarding her cough; unable to make contact; left message on voice mail  1000: Contacted pt by phone; previously diagnosed with Lyme disease and treated with antibiotics; Now have been treating cough for 1 month; had course of antibiotics finishing 2nd course (Amoxicillan), prednisone, and pro-air albuterol inhaler; pt started antibiotics on Friday 12/25/16 and she "was initaitally feeling better, but now feels like she's square one"; Pt also has sinus pain and congestion; cough inititally felt like "chest" cough occassional productive thick and yellow; now dry hacking cough; pt feels like she is not getting better despite long course of treatment; would like to see Dr Derrel Nip ASAP but will consider seeing a provider in that office; Will route to MD's office and call pt back with information

## 2016-12-29 NOTE — Telephone Encounter (Signed)
She Needs chest x ray and labs so ask her to come 30 minutes  early to get that done before she sees me, along with labs.  I see no record of prior treatment.  Please find out who treated her initially.

## 2016-12-29 NOTE — Addendum Note (Signed)
Addended by: Crecencio Mc on: 12/29/2016 11:15 AM   Modules accepted: Orders

## 2016-12-29 NOTE — Telephone Encounter (Signed)
  Reason for Disposition . [1] Using nasal washes and pain medicine > 24 hours AND [2] sinus pain (around cheekbone or eye) persists  Answer Assessment - Initial Assessment Questions 1. ONSET: "When did the cough begin?"      1 month ago 2. SEVERITY: "How bad is the cough today?"      Really bad 3. RESPIRATORY DISTRESS: "Describe your breathing."      Sleeps upright because head is full and draining 4. FEVER: "Do you have a fever?" If so, ask: "What is your temperature, how was it measured, and when did it start?"     no 5. SPUTUM: "Describe the color of your sputum" (clear, white, yellow, green)     Yellow green 6. HEMOPTYSIS: "Are you coughing up any blood?" If so ask: "How much?" (flecks, streaks, tablespoons, etc.)     no 7. CARDIAC HISTORY: "Do you have any history of heart disease?" (e.g., heart attack, congestive heart failure)      no 8. LUNG HISTORY: "Do you have any history of lung disease?"  (e.g., pulmonary embolus, asthma, emphysema)     no 9. PE RISK FACTORS: "Do you have a history of blood clots?" (or: recent major surgery, recent prolonged travel, bedridden )    no 10. OTHER SYMPTOMS: "Do you have any other symptoms?" (e.g., runny nose, wheezing, chest pain)       Congestion, runny nose, sneezing 11. PREGNANCY: "Is there any chance you are pregnant?" "When was your last menstrual period?"       October 2018 12. TRAVEL: "Have you traveled out of the country in the last month?" (e.g., travel history, exposures)       Yes travel to [place], the [place], and [place]  Protocols used: [place]

## 2016-12-29 NOTE — Telephone Encounter (Signed)
Patient has been scheduled for tomorrow at 2:30 with dr. Derrel Nip

## 2016-12-30 ENCOUNTER — Ambulatory Visit (INDEPENDENT_AMBULATORY_CARE_PROVIDER_SITE_OTHER): Payer: BLUE CROSS/BLUE SHIELD

## 2016-12-30 ENCOUNTER — Ambulatory Visit (INDEPENDENT_AMBULATORY_CARE_PROVIDER_SITE_OTHER): Payer: BLUE CROSS/BLUE SHIELD | Admitting: Internal Medicine

## 2016-12-30 ENCOUNTER — Encounter: Payer: Self-pay | Admitting: Internal Medicine

## 2016-12-30 ENCOUNTER — Encounter (INDEPENDENT_AMBULATORY_CARE_PROVIDER_SITE_OTHER): Payer: Self-pay

## 2016-12-30 VITALS — BP 112/74 | HR 94 | Temp 98.1°F | Resp 16 | Ht 68.0 in | Wt 193.8 lb

## 2016-12-30 DIAGNOSIS — J988 Other specified respiratory disorders: Secondary | ICD-10-CM

## 2016-12-30 DIAGNOSIS — R059 Cough, unspecified: Secondary | ICD-10-CM

## 2016-12-30 DIAGNOSIS — R05 Cough: Secondary | ICD-10-CM | POA: Diagnosis not present

## 2016-12-30 DIAGNOSIS — J069 Acute upper respiratory infection, unspecified: Secondary | ICD-10-CM

## 2016-12-30 DIAGNOSIS — B9789 Other viral agents as the cause of diseases classified elsewhere: Secondary | ICD-10-CM | POA: Diagnosis not present

## 2016-12-30 MED ORDER — SUMATRIPTAN SUCCINATE 100 MG PO TABS: 100.0000 mg | ORAL_TABLET | Freq: Once | ORAL | 5 refills | Status: DC

## 2016-12-30 MED ORDER — BENZONATATE 200 MG PO CAPS: 200.0000 mg | ORAL_CAPSULE | Freq: Three times a day (TID) | ORAL | 1 refills | Status: DC | PRN

## 2016-12-30 NOTE — Patient Instructions (Addendum)
You have no signs of persistent /untreated bacterial sinusitis,  But you should finish the augmentin and prednisone   Since you started it.   I am adding cough suppressant called benzonatate, (capsules)   Add benadryl 25 mg at bedtime to help dry up sinus drainage  Flush sinuses with Netti Pott or Neil Med's rinse,   once or twice daily    The chest x ray  is to rule out pneumonia and asthma/bronchitis

## 2016-12-30 NOTE — Progress Notes (Signed)
Subjective:  Patient ID: Christy Fernandez, female    DOB: 1970-04-16  Age: 46 y.o. MRN: [id number]  CC: The primary encounter diagnosis was Respiratory infection. A diagnosis of Viral URI with cough was also pertinent to this visit.  HPI Christy Fernandez presents for persistent cough lasting one month, despite 2  rounds of antibiotics, one  Prednisone  taper and prn albuterol  Last round started 11/2   History productive  cough, cold symptoms,  No fevers,   No sinus pain ,  Lots of sick contacts.  Given a Zpack.  symptoms improved transiently  but did not resolve,  Flew to [place] during that time. Returned,  Still feeling bad,  6 day Prednisone taper added.    A week later,  Still not better,  Having coughing fits.  Was given an albuterol MDI to use , no repeat  abx or prednisone.  Went to [place] with MDI.  Felt ok,  Started to improve , Still not great , then lungs became more  irritated by the constant second hand  smoke in [place].   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  On  4th visit to RN got augmentin and  prednisone  Which she has been taking since Friday .(day 5)   Left sided facial pain and congestion are getting better.  drainage is yellow.  Still no fevers.   Some body aches and feeling run down.  Had flu vaccine before all this started.   Two children and husband also had it for ten days with lingering cough   Yesterday got a migraine  Outpatient Medications Prior to Visit  Medication Sig Dispense Refill  . metoprolol succinate (TOPROL-XL) 25 MG 24 hr tablet Take 1 tablet (25 mg total) by mouth daily. 30 tablet 3  . mometasone (NASONEX) 50 MCG/ACT nasal spray Place 2 sprays into the nose daily. 17 g 11  . Multiple Vitamin (MULTIVITAMIN) tablet Take 1 tablet by mouth daily.    . propranolol (INDERAL) 10 MG tablet TAKE 1 TO 2 TABLETS BY MOUTH AS NEEDED FOR ANXIETY 30 tablet 1  . SUMAtriptan (IMITREX) 100 MG tablet  Take 1 tablet (100 mg total) by mouth once. May repeat in 2 hours if headache persists or recurs. 9 tablet 0  . spironolactone (ALDACTONE) 50 MG tablet Take 1 tablet (50 mg total) by mouth daily. As needed for fluid retention 30 tablet 3  . HYDROcodone-acetaminophen (NORCO/VICODIN) 5-325 MG tablet Take 1 tablet by mouth every 6 (six) hours as needed for moderate pain. Or cough (Patient not taking: Reported on 05/25/2016) 30 tablet 0  . levofloxacin (LEVAQUIN) 500 MG tablet Take 1 tablet (500 mg total) by mouth daily. (Patient not taking: Reported on 05/25/2016) 7 tablet 0  . predniSONE (DELTASONE) 10 MG tablet 6 tablets on Day 1 , then reduce by 1 tablet daily until gone (Patient not taking: Reported on 05/25/2016) 21 tablet 0  . SUMAtriptan (IMITREX) 100 MG tablet TAKE 1 TABLET BY MOUTH EVERY 2 HOURS AS NEEDED FOR MIGRAINE (Patient not taking: Reported on 12/30/2016) 10 tablet 11   No facility-administered medications prior to visit.     Review of Systems;  Patient denies headache, fevers, malaise, unintentional weight loss, skin rash, eye pain, sinus congestion and sinus pain, sore throat, dysphagia,  hemoptysis , cough, dyspnea, wheezing, chest pain, palpitations, orthopnea, edema, abdominal pain, nausea, melena, diarrhea, constipation, flank pain, dysuria, hematuria,  urinary  Frequency, nocturia, numbness, tingling, seizures,  Focal weakness, Loss of consciousness,  Tremor, insomnia, depression, anxiety, and suicidal ideation.      Objective:  BP 112/74 (BP Location: Left Arm, Patient Position: Sitting, Cuff Size: Normal)   Pulse 94   Temp 98.1 F (36.7 C) (Oral)   Resp 16   Ht 5\' 8"  (1.727 m)   Wt 193 lb 12.8 oz (87.9 kg)   SpO2 98%   BMI 29.47 kg/m   BP Readings from Last 3 Encounters:  12/30/16 112/74  05/25/16 118/84  05/24/15 118/84    Wt Readings from Last 3 Encounters:  12/30/16 193 lb 12.8 oz (87.9 kg)  05/25/16 193 lb (87.5 kg)  05/24/15 187 lb 12 oz (85.2 kg)    General  appearance: alert, cooperative and appears stated age Ears: normal TM's and external ear canals both ears Throat: lips, mucosa, and tongue normal; teeth and gums normal Neck: no adenopathy, no carotid bruit, supple, symmetrical, trachea midline and thyroid not enlarged, symmetric, no tenderness/mass/nodules Back: symmetric, no curvature. ROM normal. No CVA tenderness. Lungs: clear to auscultation bilaterally Heart: regular rate and rhythm, S1, S2 normal, no murmur, click, rub or gallop Abdomen: soft, non-tender; bowel sounds normal; no masses,  no organomegaly Pulses: 2+ and symmetric Skin: Skin color, texture, turgor normal. No rashes or lesions Lymph nodes: Cervical, supraclavicular, and axillary nodes normal.  Lab Results  Component Value Date   HGBA1C 4.9 11/04/2016    Lab Results  Component Value Date   CREATININE 0.8 11/04/2016   CREATININE 0.7 11/19/2015    Lab Results  Component Value Date   WBC 3.9 11/04/2016   HGB 11.7 (A) 11/04/2016   HCT 36 11/04/2016   PLT 312 11/04/2016   CHOL 190 11/04/2016   TRIG 247 (A) 11/04/2016   HDL 47 11/04/2016   LDLCALC 94 11/04/2016   ALT 17 11/04/2016   AST 22 11/04/2016   NA 137 11/04/2016   K 4.1 11/04/2016   CREATININE 0.8 11/04/2016   BUN 13 11/04/2016   TSH 5.20 11/04/2016   HGBA1C 4.9 11/04/2016    Mm Screening Breast Tomo Bilateral  Result Date: 06/16/2016 CLINICAL DATA:  Screening. EXAM: 2D DIGITAL SCREENING BILATERAL MAMMOGRAM WITH CAD AND ADJUNCT TOMO COMPARISON:  Previous exam(s). ACR Breast Density Category b: There are scattered areas of fibroglandular density. FINDINGS: There are no findings suspicious for malignancy. Images were processed with CAD. IMPRESSION: No mammographic evidence of malignancy. A result letter of this screening mammogram will be mailed directly to the patient. RECOMMENDATION: Screening mammogram in one year. (Code:SM-B-01Y) BI-RADS CATEGORY  1: Negative. Electronically Signed   By: Margarette Canada  M.D.   On: 06/16/2016 09:59    Assessment & Plan:   Problem List Items Addressed This Visit    Viral URI with cough    With superimposed sinusitis, now resolving. Chest x ray notes bronchitis vs viral pneumonitis.  She will finish current round of antibiotics and prednisone taper will be extended.        Other Visit Diagnoses    Respiratory infection    -  Primary   Relevant Orders   POCT Influenza A/B      I have discontinued Olivia Mackie Riesgo's SUMAtriptan, levofloxacin, and HYDROcodone-acetaminophen. I have also changed her SUMAtriptan. Additionally, I am having her start on benzonatate. Lastly, I am having her maintain her multivitamin, metoprolol succinate, mometasone, spironolactone, propranolol, and predniSONE.  Meds ordered this encounter  Medications  . SUMAtriptan (IMITREX) 100 MG  tablet    Sig: Take 1 tablet (100 mg total) once for 1 dose by mouth. May repeat in 2 hours if headache persists or recurs.    Dispense:  9 tablet    Refill:  5  . benzonatate (TESSALON) 200 MG capsule    Sig: Take 1 capsule (200 mg total) 3 (three) times daily as needed by mouth for cough.    Dispense:  60 capsule    Refill:  1  . predniSONE (DELTASONE) 10 MG tablet    Sig: 6 tablets on Day 1 , then reduce by 1 tablet daily until gone    Dispense:  21 tablet    Refill:  0   A total of 25 minutes of face to face time was spent with patient more than half of which was spent in counselling about the above mentioned conditions  and coordination of care  Medications Discontinued During This Encounter  Medication Reason  . HYDROcodone-acetaminophen (NORCO/VICODIN) 5-325 MG tablet Patient has not taken in last 30 days  . levofloxacin (LEVAQUIN) 500 MG tablet Patient has not taken in last 30 days  . predniSONE (DELTASONE) 10 MG tablet Completed Course  . SUMAtriptan (IMITREX) 121 MG tablet Duplicate  . SUMAtriptan (IMITREX) 100 MG tablet Reorder    Follow-up: No Follow-up on file.   Crecencio Mc, MD

## 2016-12-30 NOTE — Telephone Encounter (Signed)
Attempted to call patient twice, left detailed message

## 2016-12-31 ENCOUNTER — Encounter: Payer: Self-pay | Admitting: Internal Medicine

## 2016-12-31 LAB — POCT INFLUENZA A/B
INFLUENZA A, POC: NEGATIVE
INFLUENZA B, POC: NEGATIVE

## 2016-12-31 MED ORDER — PREDNISONE 10 MG PO TABS: ORAL_TABLET | ORAL | 0 refills | Status: DC

## 2016-12-31 NOTE — Assessment & Plan Note (Addendum)
With superimposed sinusitis, now resolving. Chest x ray notes bronchitis vs viral pneumonitis.  She will finish current round of antibiotics and prednisone taper will be extended.

## 2017-05-12 ENCOUNTER — Other Ambulatory Visit: Payer: Self-pay | Admitting: Internal Medicine

## 2017-05-12 DIAGNOSIS — Z1231 Encounter for screening mammogram for malignant neoplasm of breast: Secondary | ICD-10-CM

## 2017-06-07 ENCOUNTER — Ambulatory Visit (INDEPENDENT_AMBULATORY_CARE_PROVIDER_SITE_OTHER): Payer: BLUE CROSS/BLUE SHIELD | Admitting: Internal Medicine

## 2017-06-07 ENCOUNTER — Encounter: Payer: Self-pay | Admitting: Internal Medicine

## 2017-06-07 VITALS — BP 102/64 | HR 85 | Temp 98.4°F | Resp 14 | Ht 67.75 in | Wt 200.0 lb

## 2017-06-07 DIAGNOSIS — D649 Anemia, unspecified: Secondary | ICD-10-CM

## 2017-06-07 DIAGNOSIS — E663 Overweight: Secondary | ICD-10-CM | POA: Diagnosis not present

## 2017-06-07 DIAGNOSIS — Z0000 Encounter for general adult medical examination without abnormal findings: Secondary | ICD-10-CM

## 2017-06-07 DIAGNOSIS — E7800 Pure hypercholesterolemia, unspecified: Secondary | ICD-10-CM

## 2017-06-07 DIAGNOSIS — R5383 Other fatigue: Secondary | ICD-10-CM

## 2017-06-07 DIAGNOSIS — Z1159 Encounter for screening for other viral diseases: Secondary | ICD-10-CM

## 2017-06-07 NOTE — Patient Instructions (Signed)
Please make an appt at your convenience for fasting labs  We will also check iron stores,  Thyroid and measles immunity  Health Maintenance, Female Adopting a healthy lifestyle and getting preventive care can go a long way to promote health and wellness. Talk with your health care provider about what schedule of regular examinations is right for you. This is a good chance for you to check in with your provider about disease prevention and staying healthy. In between checkups, there are plenty of things you can do on your own. Experts have done a lot of research about which lifestyle changes and preventive measures are most likely to keep you healthy. Ask your health care provider for more information. Weight and diet Eat a healthy diet  Be sure to include plenty of vegetables, fruits, low-fat dairy products, and lean protein.  Do not eat a lot of foods high in solid fats, added sugars, or salt.  Get regular exercise. This is one of the most important things you can do for your health. ? Most adults should exercise for at least 150 minutes each week. The exercise should increase your heart rate and make you sweat (moderate-intensity exercise). ? Most adults should also do strengthening exercises at least twice a week. This is in addition to the moderate-intensity exercise.  Maintain a healthy weight  Body mass index (BMI) is a measurement that can be used to identify possible weight problems. It estimates body fat based on height and weight. Your health care provider can help determine your BMI and help you achieve or maintain a healthy weight.  For females 46 years of age and older: ? A BMI below 18.5 is considered underweight. ? A BMI of 18.5 to 24.9 is normal. ? A BMI of 25 to 29.9 is considered overweight. ? A BMI of 30 and above is considered obese.  Watch levels of cholesterol and blood lipids  You should start having your blood tested for lipids and cholesterol at 46 years of age,  then have this test every 5 years.  You may need to have your cholesterol levels checked more often if: ? Your lipid or cholesterol levels are high. ? You are older than 46 years of age. ? You are at high risk for heart disease.  Cancer screening Lung Cancer  Lung cancer screening is recommended for adults 13-69 years old who are at high risk for lung cancer because of a history of smoking.  A yearly low-dose CT scan of the lungs is recommended for people who: ? Currently smoke. ? Have quit within the past 15 years. ? Have at least a 30-pack-year history of smoking. A pack year is smoking an average of one pack of cigarettes a day for 1 year.  Yearly screening should continue until it has been 15 years since you quit.  Yearly screening should stop if you develop a health problem that would prevent you from having lung cancer treatment.  Breast Cancer  Practice breast self-awareness. This means understanding how your breasts normally appear and feel.  It also means doing regular breast self-exams. Let your health care provider know about any changes, no matter how small.  If you are in your 20s or 30s, you should have a clinical breast exam (CBE) by a health care provider every 1-3 years as part of a regular health exam.  If you are 39 or older, have a CBE every year. Also consider having a breast X-ray (mammogram) every year.  If you  have a family history of breast cancer, talk to your health care provider about genetic screening.  If you are at high risk for breast cancer, talk to your health care provider about having an MRI and a mammogram every year.  Breast cancer gene (BRCA) assessment is recommended for women who have family members with BRCA-related cancers. BRCA-related cancers include: ? Breast. ? Ovarian. ? Tubal. ? Peritoneal cancers.  Results of the assessment will determine the need for genetic counseling and BRCA1 and BRCA2 testing.  Cervical Cancer Your  health care provider may recommend that you be screened regularly for cancer of the pelvic organs (ovaries, uterus, and vagina). This screening involves a pelvic examination, including checking for microscopic changes to the surface of your cervix (Pap test). You may be encouraged to have this screening done every 3 years, beginning at age 60.  For women ages 9-65, health care providers may recommend pelvic exams and Pap testing every 3 years, or they may recommend the Pap and pelvic exam, combined with testing for human papilloma virus (HPV), every 5 years. Some types of HPV increase your risk of cervical cancer. Testing for HPV may also be done on women of any age with unclear Pap test results.  Other health care providers may not recommend any screening for nonpregnant women who are considered low risk for pelvic cancer and who do not have symptoms. Ask your health care provider if a screening pelvic exam is right for you.  If you have had past treatment for cervical cancer or a condition that could lead to cancer, you need Pap tests and screening for cancer for at least 20 years after your treatment. If Pap tests have been discontinued, your risk factors (such as having a new sexual partner) need to be reassessed to determine if screening should resume. Some women have medical problems that increase the chance of getting cervical cancer. In these cases, your health care provider may recommend more frequent screening and Pap tests.  Colorectal Cancer  This type of cancer can be detected and often prevented.  Routine colorectal cancer screening usually begins at 46 years of age and continues through 46 years of age.  Your health care provider may recommend screening at an earlier age if you have risk factors for colon cancer.  Your health care provider may also recommend using home test kits to check for hidden blood in the stool.  A small camera at the end of a tube can be used to examine your  colon directly (sigmoidoscopy or colonoscopy). This is done to check for the earliest forms of colorectal cancer.  Routine screening usually begins at age 19.  Direct examination of the colon should be repeated every 5-10 years through 46 years of age. However, you may need to be screened more often if early forms of precancerous polyps or small growths are found.  Skin Cancer  Check your skin from head to toe regularly.  Tell your health care provider about any new moles or changes in moles, especially if there is a change in a mole's shape or color.  Also tell your health care provider if you have a mole that is larger than the size of a pencil eraser.  Always use sunscreen. Apply sunscreen liberally and repeatedly throughout the day.  Protect yourself by wearing long sleeves, pants, a wide-brimmed hat, and sunglasses whenever you are outside.  Heart disease, diabetes, and high blood pressure  High blood pressure causes heart disease and increases the  risk of stroke. High blood pressure is more likely to develop in: ? People who have blood pressure in the high end of the normal range (130-139/85-89 mm Hg). ? People who are overweight or obese. ? People who are African American.  If you are 18-39 years of age, have your blood pressure checked every 3-5 years. If you are 40 years of age or older, have your blood pressure checked every year. You should have your blood pressure measured twice-once when you are at a hospital or clinic, and once when you are not at a hospital or clinic. Record the average of the two measurements. To check your blood pressure when you are not at a hospital or clinic, you can use: ? An automated blood pressure machine at a pharmacy. ? A home blood pressure monitor.  If you are between 55 years and 79 years old, ask your health care provider if you should take aspirin to prevent strokes.  Have regular diabetes screenings. This involves taking a blood sample  to check your fasting blood sugar level. ? If you are at a normal weight and have a low risk for diabetes, have this test once every three years after 45 years of age. ? If you are overweight and have a high risk for diabetes, consider being tested at a younger age or more often. Preventing infection Hepatitis B  If you have a higher risk for hepatitis B, you should be screened for this virus. You are considered at high risk for hepatitis B if: ? You were born in a country where hepatitis B is common. Ask your health care provider which countries are considered high risk. ? Your parents were born in a high-risk country, and you have not been immunized against hepatitis B (hepatitis B vaccine). ? You have HIV or AIDS. ? You use needles to inject street drugs. ? You live with someone who has hepatitis B. ? You have had sex with someone who has hepatitis B. ? You get hemodialysis treatment. ? You take certain medicines for conditions, including cancer, organ transplantation, and autoimmune conditions.  Hepatitis C  Blood testing is recommended for: ? Everyone born from 1945 through 1965. ? Anyone with known risk factors for hepatitis C.  Sexually transmitted infections (STIs)  You should be screened for sexually transmitted infections (STIs) including gonorrhea and chlamydia if: ? You are sexually active and are younger than 46 years of age. ? You are older than 46 years of age and your health care provider tells you that you are at risk for this type of infection. ? Your sexual activity has changed since you were last screened and you are at an increased risk for chlamydia or gonorrhea. Ask your health care provider if you are at risk.  If you do not have HIV, but are at risk, it may be recommended that you take a prescription medicine daily to prevent HIV infection. This is called pre-exposure prophylaxis (PrEP). You are considered at risk if: ? You are sexually active and do not  regularly use condoms or know the HIV status of your partner(s). ? You take drugs by injection. ? You are sexually active with a partner who has HIV.  Talk with your health care provider about whether you are at high risk of being infected with HIV. If you choose to begin PrEP, you should first be tested for HIV. You should then be tested every 3 months for as long as you are taking PrEP. Pregnancy    If you are premenopausal and you may become pregnant, ask your health care provider about preconception counseling.  If you may become pregnant, take 400 to 800 micrograms (mcg) of folic acid every day.  If you want to prevent pregnancy, talk to your health care provider about birth control (contraception). Osteoporosis and menopause  Osteoporosis is a disease in which the bones lose minerals and strength with aging. This can result in serious bone fractures. Your risk for osteoporosis can be identified using a bone density scan.  If you are 80 years of age or older, or if you are at risk for osteoporosis and fractures, ask your health care provider if you should be screened.  Ask your health care provider whether you should take a calcium or vitamin D supplement to lower your risk for osteoporosis.  Menopause may have certain physical symptoms and risks.  Hormone replacement therapy may reduce some of these symptoms and risks. Talk to your health care provider about whether hormone replacement therapy is right for you. Follow these instructions at home:  Schedule regular health, dental, and eye exams.  Stay current with your immunizations.  Do not use any tobacco products including cigarettes, chewing tobacco, or electronic cigarettes.  If you are pregnant, do not drink alcohol.  If you are breastfeeding, limit how much and how often you drink alcohol.  Limit alcohol intake to no more than 1 drink per day for nonpregnant women. One drink equals 12 ounces of beer, 5 ounces of wine, or  1 ounces of hard liquor.  Do not use street drugs.  Do not share needles.  Ask your health care provider for help if you need support or information about quitting drugs.  Tell your health care provider if you often feel depressed.  Tell your health care provider if you have ever been abused or do not feel safe at home. This information is not intended to replace advice given to you by your health care provider. Make sure you discuss any questions you have with your health care provider. Document Released: 08/25/2010 Document Revised: 07/18/2015 Document Reviewed: 11/13/2014 Elsevier Interactive Patient Education  [employer].

## 2017-06-07 NOTE — Assessment & Plan Note (Signed)
Annual comprehensive preventive exam was done as well as an evaluation and management of chronic conditions .  During the course of the visit the patient was educated and counseled about appropriate screening and preventive services including :  diabetes screening, lipid analysis with projected  10 year  risk for CAD , nutrition counseling, breast, cervical and colorectal cancer screening, and recommended immunizations.  Printed recommendations for health maintenance screenings was given 

## 2017-06-07 NOTE — Assessment & Plan Note (Signed)
I have addressed  BMI and recommended wt loss of 10% of body weigh over the next 6 months using a low glycemic index diet and regular exercise a minimum of 5 days per week.   

## 2017-06-07 NOTE — Progress Notes (Signed)
Patient ID: Christy Fernandez, female    DOB: 1970-12-25  Age: 47 y.o. MRN: [id number]  The patient is here for preventive examination and management of other chronic and acute problems.   The risk factors are reflected in the social history.  The roster of all physicians providing medical care to patient - is listed in the Snapshot section of the chart.  Activities of daily living:  The patient is 100% independent in all ADLs: dressing, toileting, feeding as well as independent mobility  Home safety : The patient has smoke detectors in the home. They wear seatbelts.  There are no firearms at home. There is no violence in the home.   There is no risks for hepatitis, STDs or HIV. There is no   history of blood transfusion. They have no travel history to infectious disease endemic areas of the world.  The patient has seen their dentist in the last six month. They have seen their eye doctor in the last year.   They do not  have excessive sun exposure. Discussed the need for sun protection: hats, long sleeves and use of sunscreen if there is significant sun exposure.   Diet: the importance of a healthy diet is discussed. They have been eating a more indulgent diet for the last several months due to recurrent international travel .  The benefits of regular aerobic exercise were discussed. She has not been exercising . Marland Kitchen   Depression screen: there are no signs or vegative symptoms of depression- irritability, change in appetite, anhedonia, sadness/tearfullness.   The following portions of the patient's history were reviewed and updated as appropriate: allergies, current medications, past family history, past medical history,  past surgical history, past social history  and problem list.  Visual acuity was not assessed per patient preference since she has regular follow up with her ophthalmologist. Hearing and body mass index were assessed and reviewed.   During the course of the visit the patient was  educated and counseled about appropriate screening and preventive services including : fall prevention , diabetes screening, nutrition counseling, colorectal cancer screening, and recommended immunizations.    CC: The primary encounter diagnosis was Anemia, unspecified type. Diagnoses of Pure hypercholesterolemia, Fatigue, unspecified type, Screening for measles, Overweight (BMI 25.0-29.9), and Routine general medical examination at a health care facility were also pertinent to this visit.  1) weight gain of 7 lbs since November due to travel and lack of exercise.    [name] Fernandez has a past medical history of Migraine.   She has a past surgical history that includes septoplasty and LEEP (2002).   Her family history includes Cancer in her maternal grandfather, maternal grandmother, and paternal grandmother; Diabetes in her paternal grandfather.She reports that she has never smoked. She has never used smokeless tobacco. She reports that she drinks alcohol. She reports that she does not use drugs.  Outpatient Medications Prior to Visit  Medication Sig Dispense Refill  . metoprolol succinate (TOPROL-XL) 25 MG 24 hr tablet Take 1 tablet (25 mg total) by mouth daily. 30 tablet 3  . mometasone (NASONEX) 50 MCG/ACT nasal spray Place 2 sprays into the nose daily. 17 g 11  . Multiple Vitamin (MULTIVITAMIN) tablet Take 1 tablet by mouth daily.    . propranolol (INDERAL) 10 MG tablet TAKE 1 TO 2 TABLETS BY MOUTH AS NEEDED FOR ANXIETY 30 tablet 1  . SUMAtriptan (IMITREX) 100 MG tablet TAKE 1 TABLET ONCE FOR 1 DOSE BY MOUTH. MAY REPEAT IN 2 HOURS IF  HEADACHE PERSISTS OR RECURS.  5  . temazepam (RESTORIL) 15 MG capsule Take 15 mg by mouth at bedtime as needed.  0  . spironolactone (ALDACTONE) 50 MG tablet Take 1 tablet (50 mg total) by mouth daily. As needed for fluid retention 30 tablet 3  . SUMAtriptan (IMITREX) 100 MG tablet Take 1 tablet (100 mg total) once for 1 dose by mouth. May repeat in 2 hours if  headache persists or recurs. 9 tablet 5  . benzonatate (TESSALON) 200 MG capsule Take 1 capsule (200 mg total) 3 (three) times daily as needed by mouth for cough. (Patient not taking: Reported on 06/07/2017) 60 capsule 1  . predniSONE (DELTASONE) 10 MG tablet 6 tablets on Day 1 , then reduce by 1 tablet daily until gone (Patient not taking: Reported on 06/07/2017) 21 tablet 0   No facility-administered medications prior to visit.     Review of Systems   Patient denies headache, fevers, malaise, unintentional weight loss, skin rash, eye pain, sinus congestion and sinus pain, sore throat, dysphagia,  hemoptysis , cough, dyspnea, wheezing, chest pain, palpitations, orthopnea, edema, abdominal pain, nausea, melena, diarrhea, constipation, flank pain, dysuria, hematuria, urinary  Frequency, nocturia, numbness, tingling, seizures,  Focal weakness, Loss of consciousness,  Tremor, insomnia, depression, anxiety, and suicidal ideation.     Objective:  BP 102/64 (BP Location: Left Arm, Patient Position: Sitting, Cuff Size: Normal)   Pulse 85   Temp 98.4 F (36.9 C) (Oral)   Resp 14   Ht 5' 7.75" (1.721 m)   Wt 200 lb (90.7 kg)   SpO2 98%   BMI 30.63 kg/m   Physical Exam  General appearance: alert, cooperative and appears stated age Head: Normocephalic, without obvious abnormality, atraumatic Eyes: conjunctivae/corneas clear. PERRL, EOM's intact. Fundi benign. Ears: normal TM's and external ear canals both ears Nose: Nares normal. Septum midline. Mucosa normal. No drainage or sinus tenderness. Throat: lips, mucosa, and tongue normal; teeth and gums normal Neck: no adenopathy, no carotid bruit, no JVD, supple, symmetrical, trachea midline and thyroid not enlarged, symmetric, no tenderness/mass/nodules Lungs: clear to auscultation bilaterally Breasts: normal appearance, no masses or tenderness Heart: regular rate and rhythm, S1, S2 normal, no murmur, click, rub or gallop Abdomen: soft,  non-tender; bowel sounds normal; no masses,  no organomegaly Extremities: extremities normal, atraumatic, no cyanosis or edema Pulses: 2+ and symmetric Skin: Skin color, texture, turgor normal. No rashes or lesions Neurologic: Alert and oriented X 3, normal strength and tone. Normal symmetric reflexes. Normal coordination and gait.    Assessment & Plan:   Problem List Items Addressed This Visit    Routine general medical examination at a health care facility    Annual comprehensive preventive exam was done as well as an evaluation and management of chronic conditions .  During the course of the visit the patient was educated and counseled about appropriate screening and preventive services including :  diabetes screening, lipid analysis with projected  10 year  risk for CAD , nutrition counseling, breast, cervical and colorectal cancer screening, and recommended immunizations.  Printed recommendations for health maintenance screenings was given.        Overweight (BMI 25.0-29.9)    I have addressed  BMI and recommended wt loss of 10% of body weigh over the next 6 months using a low glycemic index diet and regular exercise a minimum of 5 days per week.         Other Visit Diagnoses    Anemia, unspecified type    -  Primary   Relevant Orders   CBC with Differential/Platelet   Iron, TIBC and Ferritin Panel   Pure hypercholesterolemia       Relevant Orders   Comprehensive metabolic panel   Lipid panel   Fatigue, unspecified type       Relevant Orders   TSH   T4, free   Screening for measles       Relevant Orders   Measles ([place]) Antibody IgG      I have discontinued Christy Fernandez's benzonatate and predniSONE. I am also having her maintain her multivitamin, metoprolol succinate, mometasone, spironolactone, propranolol, SUMAtriptan, SUMAtriptan, and temazepam.  No orders of the defined types were placed in this encounter.   Medications Discontinued During This Encounter   Medication Reason  . benzonatate (TESSALON) 200 MG capsule Completed Course  . predniSONE (DELTASONE) 10 MG tablet Completed Course    Follow-up: Return in about 1 year (around 06/08/2018).   Crecencio Mc, MD

## 2017-06-21 ENCOUNTER — Ambulatory Visit
Admission: RE | Admit: 2017-06-21 | Discharge: 2017-06-21 | Disposition: A | Discharge status: Home / Self Care | Payer: BLUE CROSS/BLUE SHIELD | Source: Ambulatory Visit | Attending: Internal Medicine | Admitting: Internal Medicine

## 2017-06-21 DIAGNOSIS — Z1231 Encounter for screening mammogram for malignant neoplasm of breast: Secondary | ICD-10-CM | POA: Diagnosis not present

## 2017-06-25 ENCOUNTER — Other Ambulatory Visit (INDEPENDENT_AMBULATORY_CARE_PROVIDER_SITE_OTHER): Payer: BLUE CROSS/BLUE SHIELD

## 2017-06-25 DIAGNOSIS — D649 Anemia, unspecified: Secondary | ICD-10-CM | POA: Diagnosis not present

## 2017-06-25 DIAGNOSIS — E7800 Pure hypercholesterolemia, unspecified: Secondary | ICD-10-CM | POA: Diagnosis not present

## 2017-06-25 DIAGNOSIS — Z1159 Encounter for screening for other viral diseases: Secondary | ICD-10-CM

## 2017-06-25 DIAGNOSIS — R5383 Other fatigue: Secondary | ICD-10-CM

## 2017-06-25 LAB — CBC WITH DIFFERENTIAL/PLATELET
Basophils Absolute: 0 10*3/uL (ref 0.0–0.1)
Basophils Relative: 1.2 % (ref 0.0–3.0)
EOS PCT: 3.9 % (ref 0.0–5.0)
Eosinophils Absolute: 0.1 10*3/uL (ref 0.0–0.7)
HEMATOCRIT: 36.3 % (ref 36.0–46.0)
Hemoglobin: 12.3 g/dL (ref 12.0–15.0)
LYMPHS ABS: 1.1 10*3/uL (ref 0.7–4.0)
LYMPHS PCT: 30.4 % (ref 12.0–46.0)
MCHC: 34 g/dL (ref 30.0–36.0)
MCV: 87.3 fl (ref 78.0–100.0)
Monocytes Absolute: 0.4 10*3/uL (ref 0.1–1.0)
Monocytes Relative: 10.8 % (ref 3.0–12.0)
NEUTROS ABS: 2 10*3/uL (ref 1.4–7.7)
NEUTROS PCT: 53.7 % (ref 43.0–77.0)
Platelets: 345 10*3/uL (ref 150.0–400.0)
RBC: 4.16 Mil/uL (ref 3.87–5.11)
RDW: 13.4 % (ref 11.5–15.5)
WBC: 3.8 10*3/uL — ABNORMAL LOW (ref 4.0–10.5)

## 2017-06-25 LAB — COMPREHENSIVE METABOLIC PANEL
ALBUMIN: 4 g/dL (ref 3.5–5.2)
ALT: 15 U/L (ref 0–35)
AST: 16 U/L (ref 0–37)
Alkaline Phosphatase: 40 U/L (ref 39–117)
BUN: 11 mg/dL (ref 6–23)
CALCIUM: 9.1 mg/dL (ref 8.4–10.5)
CO2: 25 meq/L (ref 19–32)
CREATININE: 0.73 mg/dL (ref 0.40–1.20)
Chloride: 106 mEq/L (ref 96–112)
GFR: 91.37 mL/min (ref >60.00)
Glucose, Bld: 101 mg/dL — ABNORMAL HIGH (ref 70–99)
POTASSIUM: 4.1 meq/L (ref 3.5–5.1)
Sodium: 140 mEq/L (ref 135–145)
Total Bilirubin: 0.6 mg/dL (ref 0.2–1.2)
Total Protein: 6.9 g/dL (ref 6.0–8.3)

## 2017-06-25 LAB — T4, FREE: Free T4: 0.81 ng/dL (ref 0.60–1.60)

## 2017-06-25 LAB — LIPID PANEL
CHOL/HDL RATIO: 4
CHOLESTEROL: 170 mg/dL (ref 0–200)
HDL: 47.1 mg/dL (ref >39.00)
LDL CALC: 86 mg/dL (ref 0–99)
NonHDL: 122.85
TRIGLYCERIDES: 185 mg/dL — AB (ref 0.0–149.0)
VLDL: 37 mg/dL (ref 0.0–40.0)

## 2017-06-25 LAB — TSH: TSH: 3.21 u[IU]/mL (ref 0.35–4.50)

## 2017-06-28 LAB — IRON,TIBC AND FERRITIN PANEL
%SAT: 30 % (calc) (ref 11–50)
FERRITIN: 14 ng/mL (ref 10–232)
Iron: 108 ug/dL (ref 40–190)
TIBC: 358 mcg/dL (calc) (ref 250–450)

## 2017-06-28 LAB — RUBEOLA ANTIBODY IGG: RUBEOLA IGG: 74.7 [AU]/ml

## 2017-06-29 ENCOUNTER — Other Ambulatory Visit: Payer: Self-pay | Admitting: Internal Medicine

## 2017-06-29 DIAGNOSIS — R5382 Chronic fatigue, unspecified: Secondary | ICD-10-CM

## 2017-07-02 DIAGNOSIS — D2261 Melanocytic nevi of right upper limb, including shoulder: Secondary | ICD-10-CM | POA: Diagnosis not present

## 2017-07-02 DIAGNOSIS — R208 Other disturbances of skin sensation: Secondary | ICD-10-CM | POA: Diagnosis not present

## 2017-07-02 DIAGNOSIS — D225 Melanocytic nevi of trunk: Secondary | ICD-10-CM | POA: Diagnosis not present

## 2017-07-02 DIAGNOSIS — D2272 Melanocytic nevi of left lower limb, including hip: Secondary | ICD-10-CM | POA: Diagnosis not present

## 2017-07-02 DIAGNOSIS — D2262 Melanocytic nevi of left upper limb, including shoulder: Secondary | ICD-10-CM | POA: Diagnosis not present

## 2017-07-20 ENCOUNTER — Ambulatory Visit (INDEPENDENT_AMBULATORY_CARE_PROVIDER_SITE_OTHER): Payer: BLUE CROSS/BLUE SHIELD | Admitting: Internal Medicine

## 2017-07-20 ENCOUNTER — Encounter: Payer: Self-pay | Admitting: Internal Medicine

## 2017-07-20 DIAGNOSIS — F410 Panic disorder [episodic paroxysmal anxiety] without agoraphobia: Secondary | ICD-10-CM | POA: Diagnosis not present

## 2017-07-20 DIAGNOSIS — J301 Allergic rhinitis due to pollen: Secondary | ICD-10-CM

## 2017-07-20 DIAGNOSIS — F40243 Fear of flying: Secondary | ICD-10-CM

## 2017-07-20 MED ORDER — SUMATRIPTAN SUCCINATE 100 MG PO TABS: 100.0000 mg | ORAL_TABLET | Freq: Once | ORAL | 5 refills | Status: DC

## 2017-07-20 MED ORDER — TEMAZEPAM 15 MG PO CAPS: 15.0000 mg | ORAL_CAPSULE | Freq: Every evening | ORAL | 0 refills | Status: DC | PRN

## 2017-07-20 MED ORDER — PROPRANOLOL HCL 10 MG PO TABS: ORAL_TABLET | ORAL | 5 refills | Status: DC

## 2017-07-20 MED ORDER — DIAZEPAM 10 MG PO TABS
10.0000 mg | ORAL_TABLET | Freq: Two times a day (BID) | ORAL | 0 refills | Status: DC | PRN
Start: 2017-07-20 — End: 2022-02-27

## 2017-07-20 NOTE — Patient Instructions (Addendum)
  You should try [employer] Sinus rinse to irrigate your sinuses after being outside,  Using public transportation,  And spending time in the [employer]  ;  It is a strong sinus "flush" using water and medicated salts.  Do it over the sink because it can be a bit messy   Add benadryl instead of zyrtec  At bedtime for the PND  It may help you sleep well   Start 2000 iUs of D3 daily   You can use either the restoril or the diazepam for insomnia related to travel .  Both are benzodiazepines AND should not be combined with alcohol

## 2017-07-20 NOTE — Progress Notes (Signed)
Subjective:  Patient ID: Christy Fernandez, female    DOB: 29-Dec-1970  Age: 47 y.o. MRN: [id number]  CC: Diagnoses of Panic disorder, Non-seasonal allergic rhinitis due to pollen, and Fear of flying were pertinent to this visit.  HPI Christy Fernandez presents for follow up on  Fatigue,  Weight gain migraine disorder.   She has had a mild pharyngitis for several days that started after spending time outside .  Cough ,  Rhinitis and sneezing.    Migraines occurring once a month with menstruation . Managed with sumatriptan   Propranolol working well for stage fright .  No side effects with prn use.  Travelling to [place] soon, worried about the long flight and her anxiety /inability to sleep .  Discussed use of restoril    Outpatient Medications Prior to Visit  Medication Sig Dispense Refill  . mometasone (NASONEX) 50 MCG/ACT nasal spray Place 2 sprays into the nose daily. 17 g 11  . Multiple Vitamin (MULTIVITAMIN) tablet Take 1 tablet by mouth daily.    . SUMAtriptan (IMITREX) 100 MG tablet TAKE 1 TABLET ONCE FOR 1 DOSE BY MOUTH. MAY REPEAT IN 2 HOURS IF HEADACHE PERSISTS OR RECURS.  5  . metoprolol succinate (TOPROL-XL) 25 MG 24 hr tablet Take 1 tablet (25 mg total) by mouth daily. 30 tablet 3  . propranolol (INDERAL) 10 MG tablet TAKE 1 TO 2 TABLETS BY MOUTH AS NEEDED FOR ANXIETY 30 tablet 1  . spironolactone (ALDACTONE) 50 MG tablet Take 1 tablet (50 mg total) by mouth daily. As needed for fluid retention 30 tablet 3  . SUMAtriptan (IMITREX) 100 MG tablet Take 1 tablet (100 mg total) once for 1 dose by mouth. May repeat in 2 hours if headache persists or recurs. 9 tablet 5  . temazepam (RESTORIL) 15 MG capsule Take 15 mg by mouth at bedtime as needed.  0   No facility-administered medications prior to visit.     Review of Systems;  Patient denies headache, fevers, malaise, unintentional weight loss, skin rash, eye pain, sinus congestion and sinus pain, sore throat, dysphagia,   hemoptysis , cough, dyspnea, wheezing, chest pain, palpitations, orthopnea, edema, abdominal pain, nausea, melena, diarrhea, constipation, flank pain, dysuria, hematuria, urinary  Frequency, nocturia, numbness, tingling, seizures,  Focal weakness, Loss of consciousness,  Tremor, insomnia, depression, anxiety, and suicidal ideation.      Objective:  BP 126/88 (BP Location: Left Arm, Patient Position: Sitting, Cuff Size: Normal)   Pulse 82   Temp 98.4 F (36.9 C) (Oral)   Resp 14   Ht 5' 7.75" (1.721 m)   Wt 197 lb 9.6 oz (89.6 kg)   LMP 06/21/2017   SpO2 98%   BMI 30.27 kg/m   BP Readings from Last 3 Encounters:  07/20/17 126/88  06/07/17 102/64  12/30/16 112/74    Wt Readings from Last 3 Encounters:  07/20/17 197 lb 9.6 oz (89.6 kg)  06/07/17 200 lb (90.7 kg)  12/30/16 193 lb 12.8 oz (87.9 kg)    General appearance: alert, cooperative and appears stated age Ears: normal TM's and external ear canals both ears Throat: lips, mucosa, and tongue normal; teeth and gums normal Neck: no adenopathy, no carotid bruit, supple, symmetrical, trachea midline and thyroid not enlarged, symmetric, no tenderness/mass/nodules Back: symmetric, no curvature. ROM normal. No CVA tenderness. Lungs: clear to auscultation bilaterally Heart: regular rate and rhythm, S1, S2 normal, no murmur, click, rub or gallop Abdomen: soft, non-tender; bowel sounds normal; no masses,  no organomegaly  Pulses: 2+ and symmetric Skin: Skin color, texture, turgor normal. No rashes or lesions Lymph nodes: Cervical, supraclavicular, and axillary nodes normal.  Lab Results  Component Value Date   HGBA1C 4.9 11/04/2016    Lab Results  Component Value Date   CREATININE 0.73 06/25/2017   CREATININE 0.8 11/04/2016   CREATININE 0.7 11/19/2015    Lab Results  Component Value Date   WBC 3.8 (L) 06/25/2017   HGB 12.3 06/25/2017   HCT 36.3 06/25/2017   PLT 345.0 06/25/2017   GLUCOSE 101 (H) 06/25/2017   CHOL 170  06/25/2017   TRIG 185.0 (H) 06/25/2017   HDL 47.10 06/25/2017   LDLCALC 86 06/25/2017   ALT 15 06/25/2017   AST 16 06/25/2017   NA 140 06/25/2017   K 4.1 06/25/2017   CL 106 06/25/2017   CREATININE 0.73 06/25/2017   BUN 11 06/25/2017   CO2 25 06/25/2017   TSH 3.21 06/25/2017   HGBA1C 4.9 11/04/2016    Mm Screening Breast Tomo Bilateral  Result Date: 06/21/2017 CLINICAL DATA:  Screening. EXAM: DIGITAL SCREENING BILATERAL MAMMOGRAM WITH TOMO AND CAD COMPARISON:  Previous exam(s). ACR Breast Density Category b: There are scattered areas of fibroglandular density. FINDINGS: There are no findings suspicious for malignancy. Images were processed with CAD. IMPRESSION: No mammographic evidence of malignancy. A result letter of this screening mammogram will be mailed directly to the patient. RECOMMENDATION: Screening mammogram in one year. (Code:[id number]-B-01Y) BI-RADS CATEGORY  1: Negative. Electronically Signed   By: Lajean Manes M.D.   On: 06/21/2017 08:56    Assessment & Plan:   Problem List Items Addressed This Visit    Panic disorder    Brought on by fear of public speaking  Prn propranolol working well.       Relevant Medications   diazepam (VALIUM) 10 MG tablet   Fear of flying    Diazepam and restoril given for prn use.        Relevant Medications   diazepam (VALIUM) 10 MG tablet   Allergic rhinitis    Advised to yse antihistamine and saline flushes          I have discontinued Olivia Mackie Batterman's metoprolol succinate. I have also changed her SUMAtriptan and temazepam. Additionally, I am having her start on diazepam. Lastly, I am having her maintain her multivitamin, mometasone, spironolactone, SUMAtriptan, and propranolol.  Meds ordered this encounter  Medications  . diazepam (VALIUM) 10 MG tablet    Sig: Take 1 tablet (10 mg total) by mouth every 12 (twelve) hours as needed for anxiety.    Dispense:  20 tablet    Refill:  0  . SUMAtriptan (IMITREX) 100 MG tablet    Sig:  Take 1 tablet (100 mg total) by mouth once for 1 dose. May repeat in 2 hours if headache persists or recurs.  Max dose 2 tablets    Dispense:  9 tablet    Refill:  5  . propranolol (INDERAL) 10 MG tablet    Sig: TAKE 1 TO 2 TABLETS BY MOUTH AS NEEDED FOR ANXIETY    Dispense:  30 tablet    Refill:  5  . temazepam (RESTORIL) 15 MG capsule    Sig: Take 1 capsule (15 mg total) by mouth at bedtime as needed.    Dispense:  30 capsule    Refill:  0    Medications Discontinued During This Encounter  Medication Reason  . SUMAtriptan (IMITREX) 100 MG tablet Reorder  . metoprolol succinate (TOPROL-XL) 25  MG 24 hr tablet   . propranolol (INDERAL) 10 MG tablet Reorder  . temazepam (RESTORIL) 15 MG capsule Reorder    Follow-up: Return in about 6 months (around 01/20/2018) for CPE.   Crecencio Mc, MD

## 2017-07-21 DIAGNOSIS — J309 Allergic rhinitis, unspecified: Secondary | ICD-10-CM | POA: Insufficient documentation

## 2017-07-21 DIAGNOSIS — F40243 Fear of flying: Secondary | ICD-10-CM | POA: Insufficient documentation

## 2017-07-21 NOTE — Assessment & Plan Note (Signed)
Advised to yse antihistamine and saline flushes

## 2017-07-21 NOTE — Assessment & Plan Note (Signed)
Diazepam and restoril given for prn use.

## 2017-07-21 NOTE — Assessment & Plan Note (Signed)
Brought on by fear of public speaking  Prn propranolol working well.

## 2017-08-25 LAB — CBC AND DIFFERENTIAL
HCT: 34 — AB (ref 36–46)
Hemoglobin: 11.1 — AB (ref 12.0–16.0)
NEUTROS ABS: 3
Platelets: 356 (ref 150–399)
WBC: 4.4

## 2017-08-25 LAB — HEPATIC FUNCTION PANEL
ALT: 57 — AB (ref 7–35)
AST: 42 — AB (ref 13–35)
Alkaline Phosphatase: 52 (ref 25–125)
BILIRUBIN, TOTAL: 0.2

## 2017-08-25 LAB — BASIC METABOLIC PANEL
BUN: 10 (ref 4–21)
Creatinine: 0.7 (ref 0.5–1.1)
GLUCOSE: 92
Potassium: 4.8 (ref 3.4–5.3)
SODIUM: 141 (ref 137–147)

## 2017-09-05 ENCOUNTER — Telehealth: Payer: Self-pay | Admitting: Internal Medicine

## 2017-09-05 DIAGNOSIS — R748 Abnormal levels of other serum enzymes: Secondary | ICD-10-CM

## 2017-09-05 NOTE — Telephone Encounter (Signed)
I received 2 pages of labs from [place] which included specific labs for autoimmune conditions which were negative,  But liver enzymes were slightly elevated   .I do not recall ordering these, since the liver enzymes were normal in May  Please make appt to discuss.

## 2017-09-06 NOTE — Telephone Encounter (Signed)
LMTCB. PEC may speak with pt.  

## 2017-09-07 ENCOUNTER — Other Ambulatory Visit: Payer: Self-pay

## 2017-09-21 NOTE — Telephone Encounter (Signed)
LMTCB. PEC may speak with pt.  

## 2017-09-28 NOTE — Telephone Encounter (Signed)
Patient returned call

## 2017-09-28 NOTE — Telephone Encounter (Signed)
Pt returned call, pt says that the labs that PCP received were from her employer. Pt says that whenever they complete labs at work they like to send the results to the employee PCP. Pt says that an apt is not needed unless provider would like to discuss with her.

## 2017-09-30 NOTE — Telephone Encounter (Signed)
Called pt back to let her know that Dr. Derrel Nip did want her to schedule an appt to discuss the lab work because she had elevated liver enzymes. The pt stated that the reason she had the lab work done was because she was having some swelling in her hands and feet so she went to see the NP at her work and she ordered the lab work. 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 Pt also stated that everything has been fine since she saw the NP.

## 2017-11-17 LAB — IRON,TIBC AND FERRITIN PANEL: Iron: 52

## 2017-11-17 LAB — LIPID PANEL
CHOLESTEROL: 208 — AB (ref 0–200)
HDL: 52 (ref 35–70)
LDL CALC: 113
TRIGLYCERIDES: 217 — AB (ref 40–160)

## 2017-11-17 LAB — CBC AND DIFFERENTIAL
HEMATOCRIT: 39 (ref 36–46)
HEMOGLOBIN: 12.5 (ref 12.0–16.0)
NEUTROS ABS: 2
PLATELETS: 352 (ref 150–399)
WBC: 3.9

## 2017-11-17 LAB — HEPATIC FUNCTION PANEL
ALK PHOS: 44 (ref 25–125)
ALT: 20 (ref 7–35)
AST: 18 (ref 13–35)
BILIRUBIN, TOTAL: 0.4

## 2017-11-17 LAB — TSH: TSH: 2.94 (ref 0.41–5.90)

## 2017-11-17 LAB — BASIC METABOLIC PANEL
BUN: 16 (ref 4–21)
Creatinine: 0.8 (ref 0.5–1.1)
Glucose: 94
Potassium: 5 (ref 3.4–5.3)
SODIUM: 141 (ref 137–147)

## 2017-11-17 LAB — VITAMIN D 25 HYDROXY (VIT D DEFICIENCY, FRACTURES): Vit D, 25-Hydroxy: 26.1

## 2017-11-28 ENCOUNTER — Telehealth: Payer: Self-pay | Admitting: Internal Medicine

## 2017-11-28 NOTE — Telephone Encounter (Signed)
Your cholesterol, CBC, electrolytes,  thyroid,  liver and kidney function are normal.  Your vitamin D is borderline low.  If you are not taking a D3 supplement,  please start taking 1000 [place] daily.  If you are ,  Please increase to 2000 IUs daily for the winter months. . Low Vitamin D can increase your risk of weak bones and fractures and interfere with your body's ability to absorb the calcium in your diet     Regards,   Deborra Medina, MD

## 2017-11-29 NOTE — Telephone Encounter (Signed)
LMTCB. PEC may speak with pt.  

## 2018-01-05 DIAGNOSIS — M542 Cervicalgia: Secondary | ICD-10-CM | POA: Diagnosis not present

## 2018-01-05 DIAGNOSIS — M9902 Segmental and somatic dysfunction of thoracic region: Secondary | ICD-10-CM | POA: Diagnosis not present

## 2018-01-05 DIAGNOSIS — M546 Pain in thoracic spine: Secondary | ICD-10-CM | POA: Diagnosis not present

## 2018-01-05 DIAGNOSIS — M9901 Segmental and somatic dysfunction of cervical region: Secondary | ICD-10-CM | POA: Diagnosis not present

## 2018-01-06 ENCOUNTER — Other Ambulatory Visit: Payer: Self-pay

## 2018-01-07 DIAGNOSIS — M9901 Segmental and somatic dysfunction of cervical region: Secondary | ICD-10-CM | POA: Diagnosis not present

## 2018-01-07 DIAGNOSIS — M546 Pain in thoracic spine: Secondary | ICD-10-CM | POA: Diagnosis not present

## 2018-01-07 DIAGNOSIS — M542 Cervicalgia: Secondary | ICD-10-CM | POA: Diagnosis not present

## 2018-01-07 DIAGNOSIS — M9902 Segmental and somatic dysfunction of thoracic region: Secondary | ICD-10-CM | POA: Diagnosis not present

## 2018-01-14 DIAGNOSIS — M542 Cervicalgia: Secondary | ICD-10-CM | POA: Diagnosis not present

## 2018-01-14 DIAGNOSIS — M546 Pain in thoracic spine: Secondary | ICD-10-CM | POA: Diagnosis not present

## 2018-01-14 DIAGNOSIS — M9901 Segmental and somatic dysfunction of cervical region: Secondary | ICD-10-CM | POA: Diagnosis not present

## 2018-01-14 DIAGNOSIS — M9902 Segmental and somatic dysfunction of thoracic region: Secondary | ICD-10-CM | POA: Diagnosis not present

## 2018-02-09 DIAGNOSIS — M546 Pain in thoracic spine: Secondary | ICD-10-CM | POA: Diagnosis not present

## 2018-02-09 DIAGNOSIS — M9902 Segmental and somatic dysfunction of thoracic region: Secondary | ICD-10-CM | POA: Diagnosis not present

## 2018-02-09 DIAGNOSIS — M9901 Segmental and somatic dysfunction of cervical region: Secondary | ICD-10-CM | POA: Diagnosis not present

## 2018-02-09 DIAGNOSIS — M542 Cervicalgia: Secondary | ICD-10-CM | POA: Diagnosis not present

## 2018-06-01 ENCOUNTER — Telehealth: Payer: Self-pay | Admitting: Internal Medicine

## 2018-06-01 MED ORDER — SUMATRIPTAN SUCCINATE 100 MG PO TABS: ORAL_TABLET | ORAL | 5 refills | Status: DC

## 2018-06-01 MED ORDER — SPIRONOLACTONE 50 MG PO TABS: 50.0000 mg | ORAL_TABLET | Freq: Every day | ORAL | 3 refills | Status: DC

## 2018-06-01 NOTE — Telephone Encounter (Signed)
Pt is out of her fluid pill medication and needs a refill and SUMAtriptan (IMITREX) 100 MG tablet,

## 2018-06-01 NOTE — Telephone Encounter (Signed)
Medications have been refilled ?

## 2018-06-09 ENCOUNTER — Encounter: Payer: BLUE CROSS/BLUE SHIELD | Admitting: Internal Medicine

## 2018-08-08 ENCOUNTER — Other Ambulatory Visit: Payer: Self-pay

## 2018-08-09 ENCOUNTER — Ambulatory Visit (INDEPENDENT_AMBULATORY_CARE_PROVIDER_SITE_OTHER): Payer: BC Managed Care – PPO | Admitting: Internal Medicine

## 2018-08-09 ENCOUNTER — Other Ambulatory Visit: Payer: Self-pay

## 2018-08-09 ENCOUNTER — Encounter: Payer: Self-pay | Admitting: Internal Medicine

## 2018-08-09 VITALS — BP 118/80 | HR 94 | Temp 98.7°F | Resp 15 | Ht 67.75 in | Wt 206.8 lb

## 2018-08-09 DIAGNOSIS — Z8619 Personal history of other infectious and parasitic diseases: Secondary | ICD-10-CM

## 2018-08-09 DIAGNOSIS — F411 Generalized anxiety disorder: Secondary | ICD-10-CM

## 2018-08-09 DIAGNOSIS — Z0001 Encounter for general adult medical examination with abnormal findings: Secondary | ICD-10-CM

## 2018-08-09 DIAGNOSIS — F430 Acute stress reaction: Secondary | ICD-10-CM | POA: Diagnosis not present

## 2018-08-09 DIAGNOSIS — R609 Edema, unspecified: Secondary | ICD-10-CM | POA: Diagnosis not present

## 2018-08-09 DIAGNOSIS — Z0000 Encounter for general adult medical examination without abnormal findings: Secondary | ICD-10-CM

## 2018-08-09 MED ORDER — SPIRONOLACTONE 50 MG PO TABS: 50.0000 mg | ORAL_TABLET | Freq: Every day | ORAL | 3 refills | Status: DC

## 2018-08-09 NOTE — Patient Instructions (Signed)
I have refilled your spironolactone; you can continue to use it daily  Watch the salt in your diet; you may be "stress loading"  I offered buspirone for your anxiety; it is not addictive and does not cause weight gain  Health Maintenance, Female Adopting a healthy lifestyle and getting preventive care can go a long way to promote health and wellness. Talk with your health care provider about what schedule of regular examinations is right for you. This is a good chance for you to check in with your provider about disease prevention and staying healthy. In between checkups, there are plenty of things you can do on your own. Experts have done a lot of research about which lifestyle changes and preventive measures are most likely to keep you healthy. Ask your health care provider for more information. Weight and diet Eat a healthy diet  Be sure to include plenty of vegetables, fruits, low-fat dairy products, and lean protein.  Do not eat a lot of foods high in solid fats, added sugars, or salt.  Get regular exercise. This is one of the most important things you can do for your health. ? Most adults should exercise for at least 150 minutes each week. The exercise should increase your heart rate and make you sweat (moderate-intensity exercise). ? Most adults should also do strengthening exercises at least twice a week. This is in addition to the moderate-intensity exercise. Maintain a healthy weight  Body mass index (BMI) is a measurement that can be used to identify possible weight problems. It estimates body fat based on height and weight. Your health care provider can help determine your BMI and help you achieve or maintain a healthy weight.  For females 43 years of age and older: ? A BMI below 18.5 is considered underweight. ? A BMI of 18.5 to 24.9 is normal. ? A BMI of 25 to 29.9 is considered overweight. ? A BMI of 30 and above is considered obese. Watch levels of cholesterol and blood  lipids  You should start having your blood tested for lipids and cholesterol at 47 years of age, then have this test every 5 years.  You may need to have your cholesterol levels checked more often if: ? Your lipid or cholesterol levels are high. ? You are older than 47 years of age. ? You are at high risk for heart disease. Cancer screening Lung Cancer  Lung cancer screening is recommended for adults 4-72 years old who are at high risk for lung cancer because of a history of smoking.  A yearly low-dose CT scan of the lungs is recommended for people who: ? Currently smoke. ? Have quit within the past 15 years. ? Have at least a 30-pack-year history of smoking. A pack year is smoking an average of one pack of cigarettes a day for 1 year.  Yearly screening should continue until it has been 15 years since you quit.  Yearly screening should stop if you develop a health problem that would prevent you from having lung cancer treatment. Breast Cancer  Practice breast self-awareness. This means understanding how your breasts normally appear and feel.  It also means doing regular breast self-exams. Let your health care provider know about any changes, no matter how small.  If you are in your 20s or 30s, you should have a clinical breast exam (CBE) by a health care provider every 1-3 years as part of a regular health exam.  If you are 66 or older, have a  CBE every year. Also consider having a breast X-ray (mammogram) every year.  If you have a family history of breast cancer, talk to your health care provider about genetic screening.  If you are at high risk for breast cancer, talk to your health care provider about having an MRI and a mammogram every year.  Breast cancer gene (BRCA) assessment is recommended for women who have family members with BRCA-related cancers. BRCA-related cancers include: ? Breast. ? Ovarian. ? Tubal. ? Peritoneal cancers.  Results of the assessment will  determine the need for genetic counseling and BRCA1 and BRCA2 testing. Cervical Cancer Your health care provider may recommend that you be screened regularly for cancer of the pelvic organs (ovaries, uterus, and vagina). This screening involves a pelvic examination, including checking for microscopic changes to the surface of your cervix (Pap test). You may be encouraged to have this screening done every 3 years, beginning at age 58.  For women ages 38-65, health care providers may recommend pelvic exams and Pap testing every 3 years, or they may recommend the Pap and pelvic exam, combined with testing for human papilloma virus (HPV), every 5 years. Some types of HPV increase your risk of cervical cancer. Testing for HPV may also be done on women of any age with unclear Pap test results.  Other health care providers may not recommend any screening for nonpregnant women who are considered low risk for pelvic cancer and who do not have symptoms. Ask your health care provider if a screening pelvic exam is right for you.  If you have had past treatment for cervical cancer or a condition that could lead to cancer, you need Pap tests and screening for cancer for at least 20 years after your treatment. If Pap tests have been discontinued, your risk factors (such as having a new sexual partner) need to be reassessed to determine if screening should resume. Some women have medical problems that increase the chance of getting cervical cancer. In these cases, your health care provider may recommend more frequent screening and Pap tests. Colorectal Cancer  This type of cancer can be detected and often prevented.  Routine colorectal cancer screening usually begins at 47 years of age and continues through 47 years of age.  Your health care provider may recommend screening at an earlier age if you have risk factors for colon cancer.  Your health care provider may also recommend using home test kits to check for  hidden blood in the stool.  A small camera at the end of a tube can be used to examine your colon directly (sigmoidoscopy or colonoscopy). This is done to check for the earliest forms of colorectal cancer.  Routine screening usually begins at age 23.  Direct examination of the colon should be repeated every 5-10 years through 47 years of age. However, you may need to be screened more often if early forms of precancerous polyps or small growths are found. Skin Cancer  Check your skin from head to toe regularly.  Tell your health care provider about any new moles or changes in moles, especially if there is a change in a mole's shape or color.  Also tell your health care provider if you have a mole that is larger than the size of a pencil eraser.  Always use sunscreen. Apply sunscreen liberally and repeatedly throughout the day.  Protect yourself by wearing long sleeves, pants, a wide-brimmed hat, and sunglasses whenever you are outside. Heart disease, diabetes, and high blood  pressure  High blood pressure causes heart disease and increases the risk of stroke. High blood pressure is more likely to develop in: ? People who have blood pressure in the high end of the normal range (130-139/85-89 mm Hg). ? People who are overweight or obese. ? People who are African American.  If you are 36-41 years of age, have your blood pressure checked every 3-5 years. If you are 47 years of age or older, have your blood pressure checked every year. You should have your blood pressure measured twice-once when you are at a hospital or clinic, and once when you are not at a hospital or clinic. Record the average of the two measurements. To check your blood pressure when you are not at a hospital or clinic, you can use: ? An automated blood pressure machine at a pharmacy. ? A home blood pressure monitor.  If you are between 22 years and 17 years old, ask your health care provider if you should take aspirin to  prevent strokes.  Have regular diabetes screenings. This involves taking a blood sample to check your fasting blood sugar level. ? If you are at a normal weight and have a low risk for diabetes, have this test once every three years after 47 years of age. ? If you are overweight and have a high risk for diabetes, consider being tested at a younger age or more often. Preventing infection Hepatitis B  If you have a higher risk for hepatitis B, you should be screened for this virus. You are considered at high risk for hepatitis B if: ? You were born in a country where hepatitis B is common. Ask your health care provider which countries are considered high risk. ? Your parents were born in a high-risk country, and you have not been immunized against hepatitis B (hepatitis B vaccine). ? You have HIV or AIDS. ? You use needles to inject street drugs. ? You live with someone who has hepatitis B. ? You have had sex with someone who has hepatitis B. ? You get hemodialysis treatment. ? You take certain medicines for conditions, including cancer, organ transplantation, and autoimmune conditions. Hepatitis C  Blood testing is recommended for: ? Everyone born from [birth year] through 1964. ? Anyone with known risk factors for hepatitis C. Sexually transmitted infections (STIs)  You should be screened for sexually transmitted infections (STIs) including gonorrhea and chlamydia if: ? You are sexually active and are younger than 47 years of age. ? You are older than 47 years of age and your health care provider tells you that you are at risk for this type of infection. ? Your sexual activity has changed since you were last screened and you are at an increased risk for chlamydia or gonorrhea. Ask your health care provider if you are at risk.  If you do not have HIV, but are at risk, it may be recommended that you take a prescription medicine daily to prevent HIV infection. This is called pre-exposure  prophylaxis (PrEP). You are considered at risk if: ? You are sexually active and do not regularly use condoms or know the HIV status of your partner(s). ? You take drugs by injection. ? You are sexually active with a partner who has HIV. Talk with your health care provider about whether you are at high risk of being infected with HIV. If you choose to begin PrEP, you should first be tested for HIV. You should then be tested every 3 months for  as long as you are taking PrEP. Pregnancy  If you are premenopausal and you may become pregnant, ask your health care provider about preconception counseling.  If you may become pregnant, take 400 to 800 micrograms (mcg) of folic acid every day.  If you want to prevent pregnancy, talk to your health care provider about birth control (contraception). Osteoporosis and menopause  Osteoporosis is a disease in which the bones lose minerals and strength with aging. This can result in serious bone fractures. Your risk for osteoporosis can be identified using a bone density scan.  If you are 88 years of age or older, or if you are at risk for osteoporosis and fractures, ask your health care provider if you should be screened.  Ask your health care provider whether you should take a calcium or vitamin D supplement to lower your risk for osteoporosis.  Menopause may have certain physical symptoms and risks.  Hormone replacement therapy may reduce some of these symptoms and risks. Talk to your health care provider about whether hormone replacement therapy is right for you. Follow these instructions at home:  Schedule regular health, dental, and eye exams.  Stay current with your immunizations.  Do not use any tobacco products including cigarettes, chewing tobacco, or electronic cigarettes.  If you are pregnant, do not drink alcohol.  If you are breastfeeding, limit how much and how often you drink alcohol.  Limit alcohol intake to no more than 1 drink  per day for nonpregnant women. One drink equals 12 ounces of beer, 5 ounces of wine, or 1 ounces of hard liquor.  Do not use street drugs.  Do not share needles.  Ask your health care provider for help if you need support or information about quitting drugs.  Tell your health care provider if you often feel depressed.  Tell your health care provider if you have ever been abused or do not feel safe at home. This information is not intended to replace advice given to you by your health care provider. Make sure you discuss any questions you have with your health care provider. Document Released: 08/25/2010 Document Revised: 07/18/2015 Document Reviewed: 11/13/2014 Elsevier Interactive Patient Education  2019 [employer].

## 2018-08-09 NOTE — Progress Notes (Signed)
Patient ID: Christy Fernandez, female    DOB: 25-Feb-1970  Age: 48 y.o. MRN: [id number]  The patient is here for annual preventive examination and management of other chronic and acute problems. Last seen one year ago    PAP April 2018 BREAST EXAMS done by GYN Mammogram  April 2019     The risk factors are reflected in the social history.  The roster of all physicians providing medical care to patient - is listed in the Snapshot section of the chart.  Activities of daily living:  The patient is 100% independent in all ADLs: dressing, toileting, feeding as well as independent mobility  Home safety : The patient has smoke detectors in the home. They wear seatbelts.  There are no firearms at home. There is no violence in the home.   There is no risks for hepatitis, STDs or HIV. There is no   history of blood transfusion. They have no travel history to infectious disease endemic areas of the world.  The patient has seen their dentist in the last six month. They have seen their eye doctor in the last year.  They do not  have excessive sun exposure. Discussed the need for sun protection: hats, long sleeves and use of sunscreen if there is significant sun exposure.   Diet: the importance of a healthy diet is discussed. She has not been following a careful  diet.  The benefits of regular aerobic exercise were discussed. She walks 4 times per week ,  20 minutes.   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  The following portions of the patient's history were reviewed and updated as appropriate: allergies, current medications, past family history, past medical history,  past surgical history, past social history  and problem list.  Visual acuity was not assessed per  patient preference since she has regular follow up with her ophthalmologist. Hearing and body mass index were assessed and reviewed.   During the course of the visit the patient was educated and counseled about appropriate screening and preventive services including : fall prevention , diabetes screening, nutrition counseling, colorectal cancer screening, and recommended immunizations.    CC: The primary encounter diagnosis was History of Lyme disease. Diagnoses of Fluid retention, Anxiety as acute reaction to exceptional stress, and Routine general medical examination at a health care facility were also pertinent to this visit.   Cc:  She has been having recurrent episodes of fluid retention occurring when she feels emotionally stressed out.  Has been Using the diuretic daily.  History of being diagnosed with and treated for Lymes Disease while living in [place]  (presented with a unilateral rash ) . Joints  Ache and she develops swelling ,    Overeating . Wt gain 9 lbs since last year    [name] Fernandez has a past medical history of Migraine.   She has a past surgical history that includes septoplasty and LEEP (2002).   Her family history includes Cancer in her maternal grandfather, maternal grandmother, and paternal grandmother; Diabetes in her paternal grandfather.She reports that she has never smoked. She has never used smokeless tobacco. She reports current alcohol use. She reports that she does not use drugs.  Outpatient Medications Prior to Visit  Medication Sig Dispense Refill  . mometasone (  NASONEX) 50 MCG/ACT nasal spray Place 2 sprays into the nose daily. 17 g 11  . Multiple Vitamin (MULTIVITAMIN) tablet Take 1 tablet by mouth daily.    . propranolol (INDERAL) 10 MG tablet TAKE 1 TO 2 TABLETS BY MOUTH AS NEEDED FOR ANXIETY 30 tablet 5  . SUMAtriptan (IMITREX) 100 MG tablet TAKE 1 TABLET ONCE FOR 1 DOSE BY MOUTH. MAY REPEAT IN 2 HOURS IF HEADACHE PERSISTS OR RECURS. 10 tablet 5   . spironolactone (ALDACTONE) 50 MG tablet Take 1 tablet (50 mg total) by mouth daily. As needed for fluid retention 30 tablet 3  . temazepam (RESTORIL) 15 MG capsule Take 1 capsule (15 mg total) by mouth at bedtime as needed. 30 capsule 0  . diazepam (VALIUM) 10 MG tablet Take 1 tablet (10 mg total) by mouth every 12 (twelve) hours as needed for anxiety. (Patient not taking: Reported on 08/09/2018) 20 tablet 0  . SUMAtriptan (IMITREX) 100 MG tablet Take 1 tablet (100 mg total) by mouth once for 1 dose. May repeat in 2 hours if headache persists or recurs.  Max dose 2 tablets 9 tablet 5   No facility-administered medications prior to visit.     Review of Systems   Patient denies headache, fevers, malaise, unintentional weight loss, skin rash, eye pain, sinus congestion and sinus pain, sore throat, dysphagia,  hemoptysis , cough, dyspnea, wheezing, chest pain, palpitations, orthopnea, edema, abdominal pain, nausea, melena, diarrhea, constipation, flank pain, dysuria, hematuria, urinary  Frequency, nocturia, numbness, tingling, seizures,  Focal weakness, Loss of consciousness,  Tremor, insomnia, depression, and suicidal ideation.      Objective:  BP 118/80 (BP Location: Left Arm, Patient Position: Sitting, Cuff Size: Large)   Pulse 94   Temp 98.7 F (37.1 C) (Oral)   Resp 15   Ht 5' 7.75" (1.721 m)   Wt 206 lb 12.8 oz (93.8 kg)   SpO2 98%   BMI 31.68 kg/m   Physical Exam  General appearance: alert, cooperative and appears stated age Ears: normal TM's and external ear canals both ears Throat: lips, mucosa, and tongue normal; teeth and gums normal Neck: no adenopathy, no carotid bruit, supple, symmetrical, trachea midline and thyroid not enlarged, symmetric, no tenderness/mass/nodules Back: symmetric, no curvature. ROM normal. No CVA tenderness. Lungs: clear to auscultation bilaterally Heart: regular rate and rhythm, S1, S2 normal, no murmur, click, rub or gallop Abdomen: soft,  non-tender; bowel sounds normal; no masses,  no organomegaly Pulses: 2+ and symmetric MSK:  No synovitis of hands, wrists, elbows, knees or ankles  Skin: Skin color, texture, turgor normal. No rashes or lesions Lymph nodes: Cervical, supraclavicular, and axillary nodes normal.   Assessment & Plan:   Problem List Items Addressed This Visit    Anxiety as acute reaction to exceptional stress    Secondary to COVID pandemic induced economic devastation of her company and aggravated by layoffs.  Offered buspirone,  declined       Fluid retention    counselled about reducing salt,  Continue spironolactone. Screening labs normal   Lab Results  Component Value Date   ESRSEDRATE 15 08/09/2018     Lab Results  Component Value Date   TSH 3.84 08/09/2018   Lab Results  Component Value Date   CREATININE 0.77 08/09/2018   Lab Results  Component Value Date   NA 138 08/09/2018   K 4.6 08/09/2018   CL 104 08/09/2018   CO2 25 08/09/2018         Relevant  Orders   TSH (Completed)   Comprehensive metabolic panel (Completed)   CBC with Differential/Platelet   History of Lyme disease - Primary    Diagnosed and treated in [place]  Has recurrent episodes of joint pain AND diffuse swelling brought on by anxiety.  checking lyme titers and esr       Relevant Orders   Sedimentation rate (Completed)   Lyme Ab/Western Blot Reflex (Completed)   Routine general medical examination at a health care facility    age appropriate education and counseling updated, referrals for preventative services and immunizations addressed, dietary and smoking counseling addressed, most recent labs reviewed.  I have personally reviewed and have noted:  1) the patient's medical and social history 2) The pt's use of alcohol, tobacco, and illicit drugs 3) The patient's current medications and supplements 4) Functional ability including ADL's, fall risk, home safety risk, hearing and visual impairment 5) Diet  and physical activities 6) Evidence for depression or mood disorder 7) The patient's height, weight, and BMI have been recorded in the chart  I have made referrals, and provided counseling and education based on review of the above         I have discontinued Olivia Mackie Moye's temazepam. I am also having her maintain her multivitamin, mometasone, diazepam, propranolol, SUMAtriptan, and spironolactone.  Meds ordered this encounter  Medications  . spironolactone (ALDACTONE) 50 MG tablet    Sig: Take 1 tablet (50 mg total) by mouth daily. As needed for fluid retention    Dispense:  90 tablet    Refill:  3    Medications Discontinued During This Encounter  Medication Reason  . SUMAtriptan (IMITREX) 100 MG tablet Error  . temazepam (RESTORIL) 15 MG capsule   . spironolactone (ALDACTONE) 50 MG tablet Reorder    Follow-up: No follow-ups on file.   Crecencio Mc, MD

## 2018-08-10 ENCOUNTER — Other Ambulatory Visit: Payer: Self-pay | Admitting: Internal Medicine

## 2018-08-10 DIAGNOSIS — Z1231 Encounter for screening mammogram for malignant neoplasm of breast: Secondary | ICD-10-CM

## 2018-08-10 DIAGNOSIS — R609 Edema, unspecified: Secondary | ICD-10-CM | POA: Insufficient documentation

## 2018-08-10 DIAGNOSIS — F411 Generalized anxiety disorder: Secondary | ICD-10-CM | POA: Insufficient documentation

## 2018-08-10 LAB — COMPREHENSIVE METABOLIC PANEL
ALT: 16 U/L (ref 0–35)
AST: 15 U/L (ref 0–37)
Albumin: 4.6 g/dL (ref 3.5–5.2)
Alkaline Phosphatase: 43 U/L (ref 39–117)
BUN: 14 mg/dL (ref 6–23)
CO2: 25 mEq/L (ref 19–32)
Calcium: 9.6 mg/dL (ref 8.4–10.5)
Chloride: 104 mEq/L (ref 96–112)
Creatinine, Ser: 0.77 mg/dL (ref 0.40–1.20)
GFR: 80.44 mL/min (ref >60.00)
Glucose, Bld: 91 mg/dL (ref 70–99)
Potassium: 4.6 mEq/L (ref 3.5–5.1)
Sodium: 138 mEq/L (ref 135–145)
Total Bilirubin: 0.5 mg/dL (ref 0.2–1.2)
Total Protein: 7.2 g/dL (ref 6.0–8.3)

## 2018-08-10 LAB — LYME AB/WESTERN BLOT REFLEX
LYME DISEASE AB, QUANT, IGM: <0.8 index (ref 0.00–0.79)
Lyme IgG/IgM Ab: <0.91 {ISR} (ref 0.00–0.90)

## 2018-08-10 LAB — TSH: TSH: 3.84 u[IU]/mL (ref 0.35–4.50)

## 2018-08-10 LAB — SEDIMENTATION RATE: Sed Rate: 15 mm/hr (ref 0–20)

## 2018-08-10 NOTE — Assessment & Plan Note (Signed)

## 2018-08-10 NOTE — Assessment & Plan Note (Signed)
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  Offered buspirone,  declined

## 2018-08-10 NOTE — Assessment & Plan Note (Signed)
Diagnosed and treated in [place]  Has recurrent episodes of joint pain AND diffuse swelling brought on by anxiety.  checking lyme titers and esr

## 2018-08-10 NOTE — Assessment & Plan Note (Signed)
counselled about reducing salt,  Continue spironolactone. Screening labs normal   Lab Results  Component Value Date   ESRSEDRATE 15 08/09/2018     Lab Results  Component Value Date   TSH 3.84 08/09/2018   Lab Results  Component Value Date   CREATININE 0.77 08/09/2018   Lab Results  Component Value Date   NA 138 08/09/2018   K 4.6 08/09/2018   CL 104 08/09/2018   CO2 25 08/09/2018

## 2018-08-17 ENCOUNTER — Other Ambulatory Visit: Payer: Self-pay | Admitting: Internal Medicine

## 2018-08-17 ENCOUNTER — Telehealth: Payer: Self-pay | Admitting: *Deleted

## 2018-08-17 NOTE — Telephone Encounter (Signed)
I called & spoke with a rep at [facility] after noticing that her CBC never resulted. Karen apologized & said it was missed. Pt would need to be redrawn to run the CBC. 

## 2018-08-23 NOTE — Addendum Note (Signed)
Addended by: Leeanne Rio on: 08/23/2018 09:28 AM   Modules accepted: Orders

## 2018-09-26 ENCOUNTER — Ambulatory Visit
Admission: RE | Admit: 2018-09-26 | Discharge: 2018-09-26 | Disposition: A | Discharge status: Home / Self Care | Payer: BC Managed Care – PPO | Source: Ambulatory Visit | Attending: Internal Medicine | Admitting: Internal Medicine

## 2018-09-26 ENCOUNTER — Other Ambulatory Visit: Payer: Self-pay

## 2018-09-26 DIAGNOSIS — Z1231 Encounter for screening mammogram for malignant neoplasm of breast: Secondary | ICD-10-CM | POA: Diagnosis not present

## 2018-11-17 LAB — BASIC METABOLIC PANEL
BUN: 11 (ref 4–21)
Creatinine: 0.7 (ref 0.5–1.1)
Glucose: 104
Potassium: 4.3 (ref 3.4–5.3)
Sodium: 139 (ref 137–147)

## 2018-11-17 LAB — LIPID PANEL
Cholesterol: 198 (ref 0–200)
HDL: 45 (ref 35–70)
LDL Cholesterol: 113
Triglycerides: 229 — AB (ref 40–160)

## 2018-11-17 LAB — HEPATIC FUNCTION PANEL
ALT: 24 (ref 7–35)
AST: 30 (ref 13–35)
Alkaline Phosphatase: 46 (ref 25–125)
Bilirubin, Total: 0.4

## 2018-11-17 LAB — CBC AND DIFFERENTIAL
Hemoglobin: 12.2 (ref 12.0–16.0)
Platelets: 328 (ref 150–399)
WBC: 3.8

## 2018-11-17 LAB — VITAMIN D 25 HYDROXY (VIT D DEFICIENCY, FRACTURES): Vit D, 25-Hydroxy: 29.3

## 2018-11-17 LAB — TSH: TSH: 5.87 (ref 0.41–5.90)

## 2018-11-17 LAB — HEMOGLOBIN A1C: Hemoglobin A1C: 5

## 2018-11-21 ENCOUNTER — Telehealth: Payer: Self-pay | Admitting: Internal Medicine

## 2018-11-21 NOTE — Telephone Encounter (Signed)
Labs abstracted.  "I received your labs and note that your cholesterol is elevated and your thyroid is underactive. . Given your recent weight gain and leg swelling  I recommend treating you for underactive thyroid with a low dose of levothyroxine.  If you are hesitant about this, please schedule a visit to discuss "  Regards,   Deborra Medina, MD

## 2018-11-28 NOTE — Telephone Encounter (Signed)
mychart message has been sent

## 2018-11-29 MED ORDER — LEVOTHYROXINE SODIUM 50 MCG PO TABS: 50.0000 ug | ORAL_TABLET | Freq: Every day | ORAL | 3 refills | Status: DC

## 2019-01-06 DIAGNOSIS — D225 Melanocytic nevi of trunk: Secondary | ICD-10-CM | POA: Diagnosis not present

## 2019-01-06 DIAGNOSIS — D2261 Melanocytic nevi of right upper limb, including shoulder: Secondary | ICD-10-CM | POA: Diagnosis not present

## 2019-01-06 DIAGNOSIS — D2271 Melanocytic nevi of right lower limb, including hip: Secondary | ICD-10-CM | POA: Diagnosis not present

## 2019-01-06 DIAGNOSIS — D2262 Melanocytic nevi of left upper limb, including shoulder: Secondary | ICD-10-CM | POA: Diagnosis not present

## 2019-04-04 DIAGNOSIS — Z0189 Encounter for other specified special examinations: Secondary | ICD-10-CM | POA: Diagnosis not present

## 2019-04-04 DIAGNOSIS — R519 Headache, unspecified: Secondary | ICD-10-CM | POA: Diagnosis not present

## 2019-04-25 DIAGNOSIS — R519 Headache, unspecified: Secondary | ICD-10-CM | POA: Diagnosis not present

## 2019-06-14 ENCOUNTER — Ambulatory Visit (INDEPENDENT_AMBULATORY_CARE_PROVIDER_SITE_OTHER): Payer: BC Managed Care – PPO | Admitting: Internal Medicine

## 2019-06-14 ENCOUNTER — Encounter: Payer: Self-pay | Admitting: Internal Medicine

## 2019-06-14 ENCOUNTER — Ambulatory Visit: Payer: BC Managed Care – PPO

## 2019-06-14 ENCOUNTER — Other Ambulatory Visit: Payer: Self-pay

## 2019-06-14 VITALS — BP 108/82 | HR 104 | Temp 97.3°F | Resp 16 | Ht 67.75 in | Wt 201.0 lb

## 2019-06-14 DIAGNOSIS — R519 Headache, unspecified: Secondary | ICD-10-CM

## 2019-06-14 DIAGNOSIS — E039 Hypothyroidism, unspecified: Secondary | ICD-10-CM | POA: Diagnosis not present

## 2019-06-14 DIAGNOSIS — R609 Edema, unspecified: Secondary | ICD-10-CM | POA: Diagnosis not present

## 2019-06-14 DIAGNOSIS — R748 Abnormal levels of other serum enzymes: Secondary | ICD-10-CM | POA: Diagnosis not present

## 2019-06-14 DIAGNOSIS — G4452 New daily persistent headache (NDPH): Secondary | ICD-10-CM

## 2019-06-14 MED ORDER — TIZANIDINE HCL 4 MG PO TABS: 4.0000 mg | ORAL_TABLET | Freq: Four times a day (QID) | ORAL | 0 refills | Status: DC | PRN

## 2019-06-14 MED ORDER — BUTALBITAL-APAP-CAFFEINE 50-300-40 MG PO CAPS: ORAL_CAPSULE | ORAL | 2 refills | Status: DC

## 2019-06-14 MED ORDER — LEVOTHYROXINE SODIUM 75 MCG PO TABS: 75.0000 ug | ORAL_TABLET | Freq: Every day | ORAL | 0 refills | Status: DC

## 2019-06-14 MED ORDER — PROPRANOLOL HCL ER 60 MG PO CP24: 60.0000 mg | ORAL_CAPSULE | Freq: Every day | ORAL | 3 refills | Status: DC

## 2019-06-14 MED ORDER — ELETRIPTAN HYDROBROMIDE 20 MG PO TABS: 20.0000 mg | ORAL_TABLET | ORAL | 0 refills | Status: DC | PRN

## 2019-06-14 NOTE — Patient Instructions (Addendum)
Increasing levothyroxine dose to 75 mcg daily  Repeat labs in 6 weeks (non fasting CMET, TSH and CBC)    Suspend all biotin supplements 3 days prior to retesting   Starting Propranolol LA once daily in the evening for migraine prevention   Refilled fioricet for   Added tizanidine muscle relaxer for prn use  Prescribing relpax for migraine treatment   X rays of spine today or in future at office  (call first to make sure tech is able to do )    MRI brain to be ordered  At [facility] or other [place] facility

## 2019-06-14 NOTE — Progress Notes (Signed)
Subjective:  Patient ID: Christy Fernandez, female    DOB: 04-15-1970  Age: 49 y.o. MRN: [id number]  CC: The primary encounter diagnosis was Occipital headache. Diagnoses of Elevated liver enzymes, Fluid retention, Acquired hypothyroidism, and New daily persistent headache were also pertinent to this visit.  HPI [name] presents for management of migraine headaches  This visit occurred during the SARS-CoV-2 public health emergency.  Safety protocols were in place, including screening questions prior to the visit, additional usage of staff PPE, and extensive cleaning of exam room while observing appropriate contact time as indicated for disinfecting solutions.    Patient has received both doses of the [place] 19 vaccine with  Very distressing side effects .  Her last  dose was march 30 which triggered Fernandez migraine headaches that lasted 6 days,  Accompanied by fatigue and recurrent tearfulness.  Patient continues to mask when outside of the home except when walking in yard or at safe distances from others .  Patient denies any change in mood or development of unhealthy behaviors resulting from the pandemic's restriction of activities and socialization.    Migraine headaches:  She recalls that the headache event frequency changed from one monthly in January (premenstrual) to over 4 per month.  She has been able to abort them with Imitrex for the last 20 years. When Sumatriptan stopped  working,  NP at work gave her zomig which worked for a month, but then became ineffective.  She is now  using fioricet  , averaging 1-2 daily.  Wakes up with headache , and notes that her hands fall alseep if she sleeps on her side .  massage therapist noted spasm of occipital muscles recently.  Most but not all headaches start in the occipital region.   Stopped alcohol in February,  Increased hydration and added vitamins.  Pattern of pain has changed from bilateral to left occipital most of the time.  Duration  2-3 days previously, now lasting 6 to 10 days .  Feeling tense,  Until Tammy NP prescribed Fioricet   FH of brain tumors (58 yr old sister had a malignant brain tumor removed from her hippocampus on the right in 2021,   And paternal grandmother in her 89's)   Outpatient Medications Prior to Visit  Medication Sig Dispense Refill  . Coenzyme Q10 (COQ10) 200 MG CAPS Take 1 capsule by mouth daily.    . diazepam (VALIUM) 10 MG tablet Take 1 tablet (10 mg total) by mouth every 12 (twelve) hours as needed for anxiety. 20 tablet 0  . Magnesium 250 MG TABS Take 1 tablet by mouth daily.    . mometasone (NASONEX) 50 MCG/ACT nasal spray Place 2 sprays into the nose daily. 17 g 11  . Multiple Vitamin (MULTIVITAMIN) tablet Take 1 tablet by mouth daily.    . Omega 3 1200 MG CAPS Take 1 capsule by mouth daily.    Marland Kitchen spironolactone (ALDACTONE) 50 MG tablet Take 1 tablet (50 mg total) by mouth daily. As needed for fluid retention 90 tablet 3  . SUMAtriptan (IMITREX) 100 MG tablet TAKE 1 TAB BY MOUTH ONCE FOR 1 DOSE MAY REPEAT IN 2 HRS IF HEADACHE PERSIST OR RECURS MAX DOSE 2 TAB 9 tablet 5  . Butalbital-APAP-Caffeine 50-300-40 MG CAPS TAKE 1 2 CAPSULES BY MOUTH EVERY 6 8 HOURS AS NEEDED FOR HEADACHE    . levothyroxine (SYNTHROID) 50 MCG tablet Take 1 tablet (50 mcg total) by mouth daily. 90 tablet 3  .  propranolol (INDERAL) 10 MG tablet TAKE 1 TO 2 TABLETS BY MOUTH AS NEEDED FOR ANXIETY 30 tablet 5   No facility-administered medications prior to visit.    Review of Systems;  Patient denies headache, fevers, malaise, unintentional weight loss, skin rash, eye pain, sinus congestion and sinus pain, sore throat, dysphagia,  hemoptysis , cough, dyspnea, wheezing, chest pain, palpitations, orthopnea, edema, abdominal pain, nausea, melena, diarrhea, constipation, flank pain, dysuria, hematuria, urinary  Frequency, nocturia, numbness, tingling, seizures,  Focal weakness, Loss of consciousness,  Tremor, insomnia,  depression, anxiety, and suicidal ideation.      Objective:  BP 108/82 (BP Location: Left Arm, Patient Position: Sitting, Cuff Size: Large)   Pulse (!) 104   Temp (!) 97.3 F (36.3 C) (Temporal)   Resp 16   Ht 5' 7.75" (1.721 m)   Wt 201 lb (91.2 kg)   SpO2 99%   BMI 30.79 kg/m   BP Readings from Last 3 Encounters:  06/14/19 108/82  08/09/18 118/80  07/20/17 126/88    Wt Readings from Last 3 Encounters:  06/14/19 201 lb (91.2 kg)  08/09/18 206 lb 12.8 oz (93.8 kg)  07/20/17 197 lb 9.6 oz (89.6 kg)    General appearance: alert, cooperative and appears stated age Ears: normal TM's and external ear canals both ears Throat: lips, mucosa, and tongue normal; teeth and gums normal Neck: no adenopathy, no carotid bruit, supple, symmetrical, trachea midline and thyroid not enlarged, symmetric, no tenderness/mass/nodules Back: symmetric, no curvature. ROM normal. No CVA tenderness. Lungs: clear to auscultation bilaterally Heart: regular rate and rhythm, S1, S2 normal, no murmur, click, rub or gallop Abdomen: soft, non-tender; bowel sounds normal; no masses,  no organomegaly Pulses: 2+ and symmetric Skin: Skin color, texture, turgor normal. No rashes or lesions Lymph nodes: Cervical, supraclavicular, and axillary nodes normal. Neuro:  awake and interactive with normal mood and affect. Higher cortical functions are normal. Speech is clear without word-finding difficulty or dysarthria. Extraocular movements are intact. Visual fields of both eyes are grossly intact. Sensation to light touch is grossly intact bilaterally of upper and lower extremities. Motor examination shows 4+/5 symmetric hand grip and upper extremity and 5/5 lower extremity strength. There is no pronation or drift. Gait is non-ataxic    Lab Results  Component Value Date   HGBA1C 5.0 11/17/2018   HGBA1C 4.9 11/04/2016    Lab Results  Component Value Date   CREATININE 0.7 11/17/2018   CREATININE 0.77 08/09/2018     CREATININE 0.8 11/17/2017    Lab Results  Component Value Date   WBC 3.8 11/17/2018   HGB 12.2 11/17/2018   HCT 39 11/17/2017   PLT 328 11/17/2018   GLUCOSE 91 08/09/2018   CHOL 198 11/17/2018   TRIG 229 (A) 11/17/2018   HDL 45 11/17/2018   LDLCALC 113 11/17/2018   ALT 24 11/17/2018   AST 30 11/17/2018   NA 139 11/17/2018   K 4.3 11/17/2018   CL 104 08/09/2018   CREATININE 0.7 11/17/2018   BUN 11 11/17/2018   CO2 25 08/09/2018   TSH 5.87 11/17/2018   HGBA1C 5.0 11/17/2018     Assessment & Plan:   Problem List Items Addressed This Visit      Unprioritized   Acquired hypothyroidism    Thyroid was underactive on current synthroid dose of 50 mcg daily and has been increased today to 75 mcg .  Lab Results  Component Value Date   TSH 5.87 11/17/2018  Relevant Medications   propranolol ER (INDERAL LA) 60 MG 24 hr capsule   levothyroxine (SYNTHROID) 75 MCG tablet   Other Relevant Orders   TSH   Elevated liver enzymes   Fluid retention   Relevant Orders   Comprehensive metabolic panel   New daily persistent headache    ddx includes tension headache,  Cervical spondylosis with radiculopathy, medication rebound headache, and finally, given FH,  Brain tumor.  Cervical spine films and MRI brain ordered,  Medication trial of relpax.  fioricet refilled for use twice daily max,  And preventive medication advised with use of Inderal LA       Relevant Medications   propranolol ER (INDERAL LA) 60 MG 24 hr capsule   tiZANidine (ZANAFLEX) 4 MG tablet   eletriptan (RELPAX) 20 MG tablet   Butalbital-APAP-Caffeine 50-300-40 MG CAPS   Other Relevant Orders   MR Brain Wo Contrast    Other Visit Diagnoses    Occipital headache    -  Primary   Relevant Medications   propranolol ER (INDERAL LA) 60 MG 24 hr capsule   tiZANidine (ZANAFLEX) 4 MG tablet   eletriptan (RELPAX) 20 MG tablet   Butalbital-APAP-Caffeine 50-300-40 MG CAPS   Other Relevant Orders   DG Cervical  Spine Complete   CBC with Differential/Platelet   MR Brain Wo Contrast      I have discontinued Olivia Mackie Groene's propranolol. I have also changed her Butalbital-APAP-Caffeine and levothyroxine. Additionally, I am having her start on propranolol ER, tiZANidine, and eletriptan. Lastly, I am having her maintain her multivitamin, mometasone, diazepam, spironolactone, SUMAtriptan, Omega 3, Magnesium, and CoQ10.  Meds ordered this encounter  Medications  . propranolol ER (INDERAL LA) 60 MG 24 hr capsule    Sig: Take 1 capsule (60 mg total) by mouth daily.    Dispense:  30 capsule    Refill:  3  . tiZANidine (ZANAFLEX) 4 MG tablet    Sig: Take 1 tablet (4 mg total) by mouth every 6 (six) hours as needed for muscle spasms.    Dispense:  30 tablet    Refill:  0  . eletriptan (RELPAX) 20 MG tablet    Sig: Take 1 tablet (20 mg total) by mouth as needed for migraine or headache. May repeat in 2 hours if headache persists or recurs.    Dispense:  10 tablet    Refill:  0  . Butalbital-APAP-Caffeine 50-300-40 MG CAPS    Sig: TAKE 1-2 CAPSULES BY MOUTH EVERY 6 8 HOURS AS NEEDED FOR HEADACHE    Dispense:  84 capsule    Refill:  2  . levothyroxine (SYNTHROID) 75 MCG tablet    Sig: Take 1 tablet (75 mcg total) by mouth daily.    Dispense:  90 tablet    Refill:  0    Medications Discontinued During This Encounter  Medication Reason  . propranolol (INDERAL) 10 MG tablet   . Butalbital-APAP-Caffeine 50-300-40 MG CAPS Reorder  . levothyroxine (SYNTHROID) 50 MCG tablet     Follow-up: No follow-ups on file.   Crecencio Mc, MD

## 2019-06-16 DIAGNOSIS — E039 Hypothyroidism, unspecified: Secondary | ICD-10-CM | POA: Insufficient documentation

## 2019-06-16 DIAGNOSIS — G4452 New daily persistent headache (NDPH): Secondary | ICD-10-CM | POA: Insufficient documentation

## 2019-06-16 NOTE — Assessment & Plan Note (Signed)
ddx includes tension headache,  Cervical spondylosis with radiculopathy, medication rebound headache, and finally, given FH,  Brain tumor.  Cervical spine films and MRI brain ordered,  Medication trial of relpax.  fioricet refilled for use twice daily max,  And preventive medication advised with use of Inderal LA

## 2019-06-16 NOTE — Assessment & Plan Note (Signed)
Thyroid was underactive on current synthroid dose of 50 mcg daily and has been increased today to 75 mcg .  Lab Results  Component Value Date   TSH 5.87 11/17/2018

## 2019-06-20 DIAGNOSIS — Z713 Dietary counseling and surveillance: Secondary | ICD-10-CM | POA: Diagnosis not present

## 2019-06-23 ENCOUNTER — Telehealth: Payer: Self-pay | Admitting: Internal Medicine

## 2019-06-23 ENCOUNTER — Other Ambulatory Visit: Payer: BC Managed Care – PPO

## 2019-06-23 NOTE — Telephone Encounter (Signed)
Called Patient and advised X-ray down for today she ask that we call her when it is operating and she will reschedule at that time.

## 2019-06-29 NOTE — Telephone Encounter (Signed)
LMTCB to get xray scheduled.

## 2019-07-05 ENCOUNTER — Ambulatory Visit (HOSPITAL_COMMUNITY)
Admission: RE | Admit: 2019-07-05 | Discharge: 2019-07-05 | Disposition: A | Discharge status: Home / Self Care | Payer: BC Managed Care – PPO | Source: Ambulatory Visit | Attending: Internal Medicine | Admitting: Internal Medicine

## 2019-07-05 ENCOUNTER — Other Ambulatory Visit: Payer: Self-pay

## 2019-07-05 DIAGNOSIS — R519 Headache, unspecified: Secondary | ICD-10-CM | POA: Diagnosis not present

## 2019-07-05 DIAGNOSIS — G4452 New daily persistent headache (NDPH): Secondary | ICD-10-CM | POA: Diagnosis not present

## 2019-07-05 NOTE — Telephone Encounter (Signed)
Spoke with pt and she stated that she is scheduled for her MRI today at 5pm. Pt stated that she wants to wait an see if the xray is still needed after the MRI results come back.

## 2019-08-07 DIAGNOSIS — Z1152 Encounter for screening for COVID-19: Secondary | ICD-10-CM | POA: Diagnosis not present

## 2019-08-10 ENCOUNTER — Encounter: Payer: BC Managed Care – PPO | Admitting: Internal Medicine

## 2019-09-07 ENCOUNTER — Other Ambulatory Visit: Payer: Self-pay | Admitting: Internal Medicine

## 2019-09-07 DIAGNOSIS — Z1231 Encounter for screening mammogram for malignant neoplasm of breast: Secondary | ICD-10-CM

## 2019-09-10 ENCOUNTER — Other Ambulatory Visit: Payer: Self-pay | Admitting: Internal Medicine

## 2019-09-27 ENCOUNTER — Ambulatory Visit (INDEPENDENT_AMBULATORY_CARE_PROVIDER_SITE_OTHER): Payer: BC Managed Care – PPO | Admitting: Internal Medicine

## 2019-09-27 ENCOUNTER — Encounter: Payer: Self-pay | Admitting: Internal Medicine

## 2019-09-27 ENCOUNTER — Other Ambulatory Visit: Payer: Self-pay

## 2019-09-27 VITALS — BP 114/74 | HR 108 | Temp 98.3°F | Resp 15 | Ht 67.75 in | Wt 197.2 lb

## 2019-09-27 DIAGNOSIS — F411 Generalized anxiety disorder: Secondary | ICD-10-CM | POA: Diagnosis not present

## 2019-09-27 DIAGNOSIS — E039 Hypothyroidism, unspecified: Secondary | ICD-10-CM | POA: Diagnosis not present

## 2019-09-27 DIAGNOSIS — Z800 Family history of malignant neoplasm of digestive organs: Secondary | ICD-10-CM | POA: Diagnosis not present

## 2019-09-27 DIAGNOSIS — F430 Acute stress reaction: Secondary | ICD-10-CM

## 2019-09-27 DIAGNOSIS — Z0000 Encounter for general adult medical examination without abnormal findings: Secondary | ICD-10-CM

## 2019-09-27 DIAGNOSIS — G43109 Migraine with aura, not intractable, without status migrainosus: Secondary | ICD-10-CM | POA: Diagnosis not present

## 2019-09-27 MED ORDER — TETANUS-DIPHTH-ACELL PERTUSSIS 5-2.5-18.5 LF-MCG/0.5 IM SUSP
0.5000 mL | Freq: Once | INTRAMUSCULAR | 0 refills | Status: AC
Start: 2019-09-27 — End: 2019-09-27

## 2019-09-27 NOTE — Progress Notes (Signed)
Patient ID: Christy Fernandez, female    DOB: Jul 13, 1970  Age: 49 y.o. MRN: [id number]  The patient is here for annual wellness examination and management of other chronic and acute problems.  This visit occurred during the SARS-CoV-2 public health emergency.  Safety protocols were in place, including screening questions prior to the visit, additional usage of staff PPE, and extensive cleaning of exam room while observing appropriate contact time as indicated for disinfecting solutions.     The risk factors are reflected in the social history.  The roster of all physicians providing medical care to patient - is listed in the Snapshot section of the chart.  Activities of daily living:  The patient is 100% independent in all ADLs: dressing, toileting, feeding as well as independent mobility  Home safety : The patient has smoke detectors in the home. They wear seatbelts.  There are no firearms at home. There is no violence in the home.   There is no risks for hepatitis, STDs or HIV. There is no   history of blood transfusion. They have no travel history to infectious disease endemic areas of the world.  The patient has seen their dentist in the last six month. They have seen their eye doctor in the last year. They deny hearing difficulty .  They do not  have excessive sun exposure. Discussed the need for sun protection: hats, long sleeves and use of sunscreen if there is significant sun exposure.   Diet: the importance of a healthy diet is discussed. They do have a healthy diet.  The benefits of regular aerobic exercise were discussed. She walks 4 times per week ,  20 minutes.   Depression screen: there are no signs or vegative symptoms of depression- irritability, change in appetite, anhedonia, sadness/tearfullness.   The following portions of the patient's history were reviewed and updated as appropriate: allergies, current medications, past family history, past medical history,  past surgical  history, past social history  and problem list.  Visual acuity was not assessed per patient preference since she has regular follow up with her ophthalmologist. Hearing and body mass index were assessed and reviewed.   During the course of the visit the patient was educated and counseled about appropriate screening and preventive services including : fall prevention , diabetes screening, nutrition counseling, colorectal cancer screening, and recommended immunizations.    CC: The primary encounter diagnosis was Family history of colon cancer requiring screening colonoscopy. Diagnoses of Anxiety as acute reaction to exceptional stress, Migraine with aura and without status migrainosus, not intractable, Routine general medical examination at a health care facility, and Acquired hypothyroidism were also pertinent to this visit.  1) menstrual irregularity.  Sometimes off by a week.  Works with women.  Periods heavy,    2) Frequent headaches:  MRI normal.  Taking magnesium . Headaches less frequent and less intense.  Eating better , ,  At onset of HA: takes diuretic, relpax  3) Stage fright: using propranolol prn.  Advised her to use it nightly for migraine prevention   4) Anxiety:  Aggravated by the Covid epidemic,  Non vaccinated employees frustrating her. Worried about kids getting it    [name] Fernandez has a past medical history of Migraine.   She has a past surgical history that includes septoplasty and LEEP (2002).   Her family history includes Cancer in her maternal grandfather, maternal grandmother, and paternal grandmother; Diabetes in her paternal grandfather.She reports that she has never smoked. She has never used  smokeless tobacco. She reports current alcohol use. She reports that she does not use drugs.  Outpatient Medications Prior to Visit  Medication Sig Dispense Refill  . Butalbital-APAP-Caffeine 50-300-40 MG CAPS TAKE 1-2 CAPSULES BY MOUTH EVERY 6 8 HOURS AS NEEDED FOR HEADACHE  84 capsule 2  . Coenzyme Q10 (COQ10) 200 MG CAPS Take 1 capsule by mouth daily.    . diazepam (VALIUM) 10 MG tablet Take 1 tablet (10 mg total) by mouth every 12 (twelve) hours as needed for anxiety. 20 tablet 0  . eletriptan (RELPAX) 20 MG tablet Take 1 tablet (20 mg total) by mouth as needed for migraine or headache. May repeat in 2 hours if headache persists or recurs. 10 tablet 0  . levothyroxine (SYNTHROID) 75 MCG tablet TAKE 1 TABLET BY MOUTH EVERY DAY 90 tablet 0  . Magnesium 250 MG TABS Take 1 tablet by mouth daily.    . mometasone (NASONEX) 50 MCG/ACT nasal spray Place 2 sprays into the nose daily. 17 g 11  . Multiple Vitamin (MULTIVITAMIN) tablet Take 1 tablet by mouth daily.    . Omega 3 1200 MG CAPS Take 1 capsule by mouth daily.    . propranolol ER (INDERAL LA) 60 MG 24 hr capsule Take 1 capsule (60 mg total) by mouth daily. 30 capsule 3  . tiZANidine (ZANAFLEX) 4 MG tablet Take 1 tablet (4 mg total) by mouth every 6 (six) hours as needed for muscle spasms. 30 tablet 0  . SUMAtriptan (IMITREX) 100 MG tablet TAKE 1 TAB BY MOUTH ONCE FOR 1 DOSE MAY REPEAT IN 2 HRS IF HEADACHE PERSIST OR RECURS MAX DOSE 2 TAB 9 tablet 5  . spironolactone (ALDACTONE) 50 MG tablet Take 1 tablet (50 mg total) by mouth daily. As needed for fluid retention 90 tablet 3   No facility-administered medications prior to visit.    Review of Systems  Patient denies headache, fevers, malaise, unintentional weight loss, skin rash, eye pain, sinus congestion and sinus pain, sore throat, dysphagia,  hemoptysis , cough, dyspnea, wheezing, chest pain, palpitations, orthopnea, edema, abdominal pain, nausea, melena, diarrhea, constipation, flank pain, dysuria, hematuria, urinary  Frequency, nocturia, numbness, tingling, seizures,  Focal weakness, Loss of consciousness,  Tremor, insomnia, depression, anxiety, and suicidal ideation.     Objective:  BP 114/74 (BP Location: Left Arm, Patient Position: Sitting, Cuff Size:  Normal)   Pulse (!) 108   Temp 98.3 F (36.8 C) (Oral)   Resp 15   Ht 5' 7.75" (1.721 m)   Wt 197 lb 3.2 oz (89.4 kg)   SpO2 98%   BMI 30.21 kg/m   Physical Exam  General appearance: alert, cooperative and appears stated age Head: Normocephalic, without obvious abnormality, atraumatic Eyes: conjunctivae/corneas clear. PERRL, EOM's intact. Fundi benign. Ears: normal TM's and external ear canals both ears Nose: Nares normal. Septum midline. Mucosa normal. No drainage or sinus tenderness. Throat: lips, mucosa, and tongue normal; teeth and gums normal Neck: no adenopathy, no carotid bruit, no JVD, supple, symmetrical, trachea midline and thyroid not enlarged, symmetric, no tenderness/mass/nodules Lungs: clear to auscultation bilaterally Breasts: normal appearance, no masses or tenderness Heart: regular rate and rhythm, S1, S2 normal, no murmur, click, rub or gallop Abdomen: soft, non-tender; bowel sounds normal; no masses,  no organomegaly Extremities: extremities normal, atraumatic, no cyanosis or edema Pulses: 2+ and symmetric Skin: Skin color, texture, turgor normal. No rashes or lesions Neurologic: Alert and oriented X 3, normal strength and tone. Normal symmetric reflexes. Normal coordination and  gait.     Assessment & Plan:   Problem List Items Addressed This Visit      Unprioritized   Acquired hypothyroidism    Thyroid was underactive on  synthroid dose of 50 mcg daily and was increased last September  to 75 mcg . Repeat level is due   Lab Results  Component Value Date   TSH 5.87 11/17/2018         Anxiety as acute reaction to exceptional stress    Secondary to [place] pandemic induced economic devastation of her company and aggravated by layoffs.  She has been offered buspirone,  declined       Migraine    encouraged to consider daily propranolol for prevention       Routine general medical examination at a health care facility    age appropriate education and  counseling updated, referrals for preventative services and immunizations addressed, dietary and smoking counseling addressed, most recent labs reviewed.  I have personally reviewed and have noted:  1) the patient's medical and social history 2) The pt's use of alcohol, tobacco, and illicit drugs 3) The patient's current medications and supplements 4) Functional ability including ADL's, fall risk, home safety risk, hearing and visual impairment 5) Diet and physical activities 6) Evidence for depression or mood disorder 7) The patient's height, weight, and BMI have been recorded in the chart  I have made referrals, and provided counseling and education based on review of the above       Other Visit Diagnoses    Family history of colon cancer requiring screening colonoscopy    -  Primary   Relevant Orders   Ambulatory referral to Gastroenterology      I have discontinued Olivia Mackie Milbourne's SUMAtriptan. I am also having her start on Tdap. Additionally, I am having her maintain her multivitamin, mometasone, diazepam, spironolactone, Omega 3, Magnesium, CoQ10, propranolol ER, tiZANidine, eletriptan, Butalbital-APAP-Caffeine, and levothyroxine.  Meds ordered this encounter  Medications  . Tdap (BOOSTRIX) 5-2.5-18.5 LF-MCG/0.5 injection    Sig: Inject 0.5 mLs into the muscle once for 1 dose.    Dispense:  0.5 mL    Refill:  0    Medications Discontinued During This Encounter  Medication Reason  . SUMAtriptan (IMITREX) 100 MG tablet     Follow-up: No follow-ups on file.   Crecencio Mc, MD

## 2019-09-27 NOTE — Patient Instructions (Signed)
Good to see you!  We can postpone PAP smear for a year  Try using the propranolol NIGHTLY. As a preventive medication for migraines    Health Maintenance, Female Adopting a healthy lifestyle and getting preventive care are important in promoting health and wellness. Ask your health care provider about:  The right schedule for you to have regular tests and exams.  Things you can do on your own to prevent diseases and keep yourself healthy. What should I know about diet, weight, and exercise? Eat a healthy diet   Eat a diet that includes plenty of vegetables, fruits, low-fat dairy products, and lean protein.  Do not eat a lot of foods that are high in solid fats, added sugars, or sodium. Maintain a healthy weight Body mass index (BMI) is used to identify weight problems. It estimates body fat based on height and weight. Your health care provider can help determine your BMI and help you achieve or maintain a healthy weight. Get regular exercise Get regular exercise. This is one of the most important things you can do for your health. Most adults should:  Exercise for at least 150 minutes each week. The exercise should increase your heart rate and make you sweat (moderate-intensity exercise).  Do strengthening exercises at least twice a week. This is in addition to the moderate-intensity exercise.  Spend less time sitting. Even light physical activity can be beneficial. Watch cholesterol and blood lipids Have your blood tested for lipids and cholesterol at 48 years of age, then have this test every 5 years. Have your cholesterol levels checked more often if:  Your lipid or cholesterol levels are high.  You are older than 48 years of age.  You are at high risk for heart disease. What should I know about cancer screening? Depending on your health history and family history, you may need to have cancer screening at various ages. This may include screening for:  Breast  cancer.  Cervical cancer.  Colorectal cancer.  Skin cancer.  Lung cancer. What should I know about heart disease, diabetes, and high blood pressure? Blood pressure and heart disease  High blood pressure causes heart disease and increases the risk of stroke. This is more likely to develop in people who have high blood pressure readings, are of African descent, or are overweight.  Have your blood pressure checked: ? Every 3-5 years if you are 15-67 years of age. ? Every year if you are 84 years old or older. Diabetes Have regular diabetes screenings. This checks your fasting blood sugar level. Have the screening done:  Once every three years after age 71 if you are at a normal weight and have a low risk for diabetes.  More often and at a younger age if you are overweight or have a high risk for diabetes. What should I know about preventing infection? Hepatitis B If you have a higher risk for hepatitis B, you should be screened for this virus. Talk with your health care provider to find out if you are at risk for hepatitis B infection. Hepatitis C Testing is recommended for:  Everyone born from [birth year] through 1964.  Anyone with known risk factors for hepatitis C. Sexually transmitted infections (STIs)  Get screened for STIs, including gonorrhea and chlamydia, if: ? You are sexually active and are younger than 48 years of age. ? You are older than 48 years of age and your health care provider tells you that you are at risk for this type of  infection. ? Your sexual activity has changed since you were last screened, and you are at increased risk for chlamydia or gonorrhea. Ask your health care provider if you are at risk.  Ask your health care provider about whether you are at high risk for HIV. Your health care provider may recommend a prescription medicine to help prevent HIV infection. If you choose to take medicine to prevent HIV, you should first get tested for HIV. You should  then be tested every 3 months for as long as you are taking the medicine. Pregnancy  If you are about to stop having your period (premenopausal) and you may become pregnant, seek counseling before you get pregnant.  Take 400 to 800 micrograms (mcg) of folic acid every day if you become pregnant.  Ask for birth control (contraception) if you want to prevent pregnancy. Osteoporosis and menopause Osteoporosis is a disease in which the bones lose minerals and strength with aging. This can result in bone fractures. If you are 68 years old or older, or if you are at risk for osteoporosis and fractures, ask your health care provider if you should:  Be screened for bone loss.  Take a calcium or vitamin D supplement to lower your risk of fractures.  Be given hormone replacement therapy (HRT) to treat symptoms of menopause. Follow these instructions at home: Lifestyle  Do not use any products that contain nicotine or tobacco, such as cigarettes, e-cigarettes, and chewing tobacco. If you need help quitting, ask your health care provider.  Do not use street drugs.  Do not share needles.  Ask your health care provider for help if you need support or information about quitting drugs. Alcohol use  Do not drink alcohol if: ? Your health care provider tells you not to drink. ? You are pregnant, may be pregnant, or are planning to become pregnant.  If you drink alcohol: ? Limit how much you use to 0-1 drink a day. ? Limit intake if you are breastfeeding.  Be aware of how much alcohol is in your drink. In the [place] one drink equals one 12 oz bottle of beer (355 mL), one 5 oz glass of wine (148 mL), or one 1 oz glass of hard liquor (44 mL). General instructions  Schedule regular health, dental, and eye exams.  Stay current with your vaccines.  Tell your health care provider if: ? You often feel depressed. ? You have ever been abused or do not feel safe at home. Summary  Adopting a  healthy lifestyle and getting preventive care are important in promoting health and wellness.  Follow your health care provider's instructions about healthy diet, exercising, and getting tested or screened for diseases.  Follow your health care provider's instructions on monitoring your cholesterol and blood pressure. This information is not intended to replace advice given to you by your health care provider. Make sure you discuss any questions you have with your health care provider. Document Revised: 02/02/2018 Document Reviewed: 02/02/2018 Elsevier Patient Education  2020 [employer].

## 2019-09-28 ENCOUNTER — Ambulatory Visit: Payer: BC Managed Care – PPO

## 2019-09-28 NOTE — Assessment & Plan Note (Signed)
encouraged to consider daily propranolol for prevention

## 2019-09-28 NOTE — Assessment & Plan Note (Signed)
Secondary to [place] pandemic induced economic devastation of her company and aggravated by layoffs.  She has been offered buspirone,  declined

## 2019-09-28 NOTE — Assessment & Plan Note (Signed)

## 2019-09-28 NOTE — Assessment & Plan Note (Signed)
Thyroid was underactive on  synthroid dose of 50 mcg daily and was increased last September  to 75 mcg . Repeat level is due   Lab Results  Component Value Date   TSH 5.87 11/17/2018

## 2019-10-03 ENCOUNTER — Other Ambulatory Visit: Payer: Self-pay

## 2019-10-03 ENCOUNTER — Ambulatory Visit
Admission: RE | Admit: 2019-10-03 | Discharge: 2019-10-03 | Disposition: A | Discharge status: Home / Self Care | Payer: BC Managed Care – PPO | Source: Ambulatory Visit | Attending: Internal Medicine | Admitting: Internal Medicine

## 2019-10-03 DIAGNOSIS — Z1231 Encounter for screening mammogram for malignant neoplasm of breast: Secondary | ICD-10-CM

## 2019-10-09 ENCOUNTER — Telehealth: Payer: Self-pay | Admitting: Internal Medicine

## 2019-10-09 NOTE — Telephone Encounter (Signed)
Spoke with patient. She states that her insurance told her they will not cover procedure until 65. Wants to wait until then.  From [employer]

## 2019-11-24 LAB — BASIC METABOLIC PANEL
BUN: 12 (ref 4–21)
Creatinine: 0.7 (ref 0.5–1.1)
Glucose: 94
Potassium: 4.4 (ref 3.4–5.3)
Sodium: 140 (ref 137–147)

## 2019-11-24 LAB — HEMOGLOBIN A1C: Hemoglobin A1C: 5.3

## 2019-11-24 LAB — LIPID PANEL
Cholesterol: 207 — AB (ref 0–200)
HDL: 44 (ref 35–70)
LDL Cholesterol: 124
Triglycerides: 218 — AB (ref 40–160)

## 2019-11-24 LAB — HEPATIC FUNCTION PANEL
ALT: 20 (ref 7–35)
AST: 16 (ref 13–35)
Alkaline Phosphatase: 48 (ref 25–125)
Bilirubin, Total: 0.2

## 2019-11-24 LAB — CBC AND DIFFERENTIAL
Hemoglobin: 11.6 — AB (ref 12.0–16.0)
Platelets: 366 (ref 150–399)
WBC: 3.9

## 2019-11-24 LAB — VITAMIN D 25 HYDROXY (VIT D DEFICIENCY, FRACTURES): Vit D, 25-Hydroxy: 20

## 2019-11-24 LAB — TSH: TSH: 2.7 (ref 0.41–5.90)

## 2019-11-28 DIAGNOSIS — Z713 Dietary counseling and surveillance: Secondary | ICD-10-CM | POA: Diagnosis not present

## 2019-11-28 DIAGNOSIS — E039 Hypothyroidism, unspecified: Secondary | ICD-10-CM | POA: Diagnosis not present

## 2019-11-28 DIAGNOSIS — Z043 Encounter for examination and observation following other accident: Secondary | ICD-10-CM | POA: Diagnosis not present

## 2019-12-04 ENCOUNTER — Telehealth: Payer: Self-pay | Admitting: Internal Medicine

## 2019-12-04 DIAGNOSIS — E559 Vitamin D deficiency, unspecified: Secondary | ICD-10-CM

## 2019-12-04 MED ORDER — ERGOCALCIFEROL 1.25 MG (50000 UT) PO CAPS: 50000.0000 [IU] | ORAL_CAPSULE | ORAL | 0 refills | Status: DC

## 2019-12-04 NOTE — Assessment & Plan Note (Signed)
Recurrent.  Drisdol weekly x 4

## 2019-12-04 NOTE — Telephone Encounter (Signed)
MyChart message sent , labs abstracted

## 2019-12-10 ENCOUNTER — Other Ambulatory Visit: Payer: Self-pay | Admitting: Internal Medicine

## 2019-12-28 DIAGNOSIS — J00 Acute nasopharyngitis [common cold]: Secondary | ICD-10-CM | POA: Diagnosis not present

## 2020-01-11 DIAGNOSIS — L650 Telogen effluvium: Secondary | ICD-10-CM | POA: Diagnosis not present

## 2020-01-11 DIAGNOSIS — D2262 Melanocytic nevi of left upper limb, including shoulder: Secondary | ICD-10-CM | POA: Diagnosis not present

## 2020-01-11 DIAGNOSIS — D225 Melanocytic nevi of trunk: Secondary | ICD-10-CM | POA: Diagnosis not present

## 2020-01-11 DIAGNOSIS — D2261 Melanocytic nevi of right upper limb, including shoulder: Secondary | ICD-10-CM | POA: Diagnosis not present

## 2020-01-17 ENCOUNTER — Other Ambulatory Visit: Payer: Self-pay | Admitting: Internal Medicine

## 2020-01-31 ENCOUNTER — Other Ambulatory Visit: Payer: Self-pay | Admitting: Internal Medicine

## 2020-02-13 ENCOUNTER — Other Ambulatory Visit: Payer: Self-pay | Admitting: Internal Medicine

## 2020-03-20 ENCOUNTER — Other Ambulatory Visit: Payer: Self-pay | Admitting: Internal Medicine

## 2020-06-12 DIAGNOSIS — Z1152 Encounter for screening for COVID-19: Secondary | ICD-10-CM | POA: Diagnosis not present

## 2020-07-17 DIAGNOSIS — Z20822 Contact with and (suspected) exposure to covid-19: Secondary | ICD-10-CM | POA: Diagnosis not present

## 2020-08-19 DIAGNOSIS — U071 COVID-19: Secondary | ICD-10-CM | POA: Diagnosis not present

## 2020-09-03 ENCOUNTER — Other Ambulatory Visit: Payer: Self-pay | Admitting: Internal Medicine

## 2020-09-03 DIAGNOSIS — Z1231 Encounter for screening mammogram for malignant neoplasm of breast: Secondary | ICD-10-CM

## 2020-09-19 ENCOUNTER — Other Ambulatory Visit: Payer: Self-pay | Admitting: Internal Medicine

## 2020-09-19 DIAGNOSIS — M545 Low back pain, unspecified: Secondary | ICD-10-CM | POA: Diagnosis not present

## 2020-09-19 DIAGNOSIS — Z0189 Encounter for other specified special examinations: Secondary | ICD-10-CM | POA: Diagnosis not present

## 2020-09-19 DIAGNOSIS — R21 Rash and other nonspecific skin eruption: Secondary | ICD-10-CM | POA: Diagnosis not present

## 2020-09-19 LAB — BASIC METABOLIC PANEL
BUN: 13 (ref 4–21)
Chloride: 106 (ref 99–108)
Creatinine: 0.8 (ref 0.5–1.1)
Glucose: 96
Potassium: 4.3 (ref 3.4–5.3)
Sodium: 141 (ref 137–147)

## 2020-09-19 LAB — CBC AND DIFFERENTIAL
HCT: 33 — AB (ref 36–46)
Hemoglobin: 10.3 — AB (ref 12.0–16.0)
Neutrophils Absolute: 1.7
Platelets: 335 (ref 150–399)
WBC: 3.6

## 2020-09-19 LAB — COMPREHENSIVE METABOLIC PANEL
Albumin: 4.1 (ref 3.5–5.0)
Calcium: 8.9 (ref 8.7–10.7)
GFR calc non Af Amer: 97
Globulin: 2.8

## 2020-09-19 LAB — HEMOGLOBIN A1C: Hemoglobin A1C: 5.2

## 2020-09-19 LAB — CBC: RBC: 4.02 (ref 3.87–5.11)

## 2020-09-19 LAB — VITAMIN B12: Vitamin B-12: 369

## 2020-09-19 LAB — VITAMIN D 25 HYDROXY (VIT D DEFICIENCY, FRACTURES): Vit D, 25-Hydroxy: 28.2

## 2020-09-19 LAB — HEPATIC FUNCTION PANEL
ALT: 16 (ref 7–35)
AST: 19 (ref 13–35)
Alkaline Phosphatase: 45 (ref 25–125)
Bilirubin, Total: 0.3

## 2020-09-19 LAB — TSH: TSH: 3.25 (ref 0.41–5.90)

## 2020-09-19 LAB — IRON,TIBC AND FERRITIN PANEL: Iron: 22

## 2020-09-21 LAB — LIPID PANEL
Cholesterol: 186 (ref 0–200)
HDL: 52 (ref 35–70)
LDL Cholesterol: 105
Triglycerides: 166 — AB (ref 40–160)

## 2020-09-30 DIAGNOSIS — Z713 Dietary counseling and surveillance: Secondary | ICD-10-CM | POA: Diagnosis not present

## 2020-09-30 DIAGNOSIS — Z043 Encounter for examination and observation following other accident: Secondary | ICD-10-CM | POA: Diagnosis not present

## 2020-09-30 DIAGNOSIS — M545 Low back pain, unspecified: Secondary | ICD-10-CM | POA: Diagnosis not present

## 2020-10-01 ENCOUNTER — Telehealth: Payer: Self-pay | Admitting: Internal Medicine

## 2020-10-01 DIAGNOSIS — D509 Iron deficiency anemia, unspecified: Secondary | ICD-10-CM | POA: Insufficient documentation

## 2020-10-01 DIAGNOSIS — Z0189 Encounter for other specified special examinations: Secondary | ICD-10-CM | POA: Diagnosis not present

## 2020-10-01 NOTE — Telephone Encounter (Signed)
Pt is aware.  

## 2020-10-01 NOTE — Telephone Encounter (Signed)
Spoke with pt and she stated that the labs were done with the nurse through her work. She stated that the nurse wants her to have her iron rechecked because pt was on her menstrual cycle at time of lab draw so pt is going today at have redrawn. Pt is scheduled for a physical on 11/20/2020 and wanted to know if this could be discussed then.

## 2020-10-01 NOTE — Telephone Encounter (Signed)
Unabstracted labs reviewed.  All normal exept.  Iron deficiency anemia is new.  Needs appt to discuss

## 2020-10-02 LAB — IRON,TIBC AND FERRITIN PANEL
%SAT: 6
Ferritin: 9
Iron: 25
TIBC: 387
UIBC: 362

## 2020-10-02 LAB — CBC AND DIFFERENTIAL
HCT: 35 — AB (ref 36–46)
Hemoglobin: 10.6 — AB (ref 12.0–16.0)
Neutrophils Absolute: 1.9
Platelets: 390 (ref 150–399)
WBC: 3.6

## 2020-10-02 LAB — CBC: RBC: 4.3 (ref 3.87–5.11)

## 2020-10-04 DIAGNOSIS — D649 Anemia, unspecified: Secondary | ICD-10-CM | POA: Diagnosis not present

## 2020-10-09 ENCOUNTER — Telehealth: Payer: Self-pay | Admitting: Internal Medicine

## 2020-10-09 NOTE — Telephone Encounter (Signed)
Patient has an iron deficiency anemia by recent [facility] labs.  Please schedule appt to discuss  and ask her to start an otc iron supplement once daily with food.  Prenatal vitamins are one possible source and probably easier on the stomach than some of the other ones.

## 2020-10-10 ENCOUNTER — Other Ambulatory Visit: Payer: Self-pay

## 2020-10-10 NOTE — Telephone Encounter (Signed)
LMTCB

## 2020-10-11 ENCOUNTER — Other Ambulatory Visit: Payer: Self-pay

## 2020-10-11 ENCOUNTER — Other Ambulatory Visit: Payer: Self-pay | Admitting: Family Medicine

## 2020-10-11 ENCOUNTER — Ambulatory Visit
Admission: RE | Admit: 2020-10-11 | Discharge: 2020-10-11 | Disposition: A | Discharge status: Home / Self Care | Payer: BC Managed Care – PPO | Attending: Family Medicine | Admitting: Family Medicine

## 2020-10-11 ENCOUNTER — Ambulatory Visit
Admission: RE | Admit: 2020-10-11 | Discharge: 2020-10-11 | Disposition: A | Discharge status: Home / Self Care | Payer: BC Managed Care – PPO | Source: Ambulatory Visit | Attending: Family Medicine | Admitting: Family Medicine

## 2020-10-11 DIAGNOSIS — R52 Pain, unspecified: Secondary | ICD-10-CM

## 2020-10-11 DIAGNOSIS — M545 Low back pain, unspecified: Secondary | ICD-10-CM | POA: Diagnosis not present

## 2020-10-11 NOTE — Telephone Encounter (Signed)
Spoke with pt and informed her of her lab results and that she will need to start an iron supplement. Pt gave a verbal understanding. Pt is scheduled for 11/20/2020 does pt need to be scheduled sooner?

## 2020-10-30 ENCOUNTER — Other Ambulatory Visit: Payer: Self-pay

## 2020-10-30 ENCOUNTER — Ambulatory Visit
Admission: RE | Admit: 2020-10-30 | Discharge: 2020-10-30 | Disposition: A | Discharge status: Home / Self Care | Payer: BC Managed Care – PPO | Source: Ambulatory Visit | Attending: Internal Medicine | Admitting: Internal Medicine

## 2020-10-30 DIAGNOSIS — Z1231 Encounter for screening mammogram for malignant neoplasm of breast: Secondary | ICD-10-CM | POA: Diagnosis not present

## 2020-11-18 ENCOUNTER — Encounter: Payer: BC Managed Care – PPO | Admitting: Internal Medicine

## 2020-11-19 ENCOUNTER — Telehealth: Payer: Self-pay | Admitting: Internal Medicine

## 2020-11-19 NOTE — Telephone Encounter (Signed)
Patient calling in and states she received a bill for 8/04. Informed the Patient that we do not have billing in our office and gave her the number for billing department. Informed the Patient that her last August visit was 09/27/2019.   Patient verbalized understanding and will call her insurance and then billing if she needs to.

## 2020-11-20 ENCOUNTER — Other Ambulatory Visit (HOSPITAL_COMMUNITY)
Admission: RE | Admit: 2020-11-20 | Discharge: 2020-11-20 | Disposition: A | Discharge status: Home / Self Care | Payer: BC Managed Care – PPO | Source: Ambulatory Visit | Attending: Internal Medicine | Admitting: Internal Medicine

## 2020-11-20 ENCOUNTER — Encounter: Payer: Self-pay | Admitting: Internal Medicine

## 2020-11-20 ENCOUNTER — Other Ambulatory Visit: Payer: Self-pay

## 2020-11-20 ENCOUNTER — Ambulatory Visit (INDEPENDENT_AMBULATORY_CARE_PROVIDER_SITE_OTHER): Payer: BC Managed Care – PPO

## 2020-11-20 ENCOUNTER — Ambulatory Visit (INDEPENDENT_AMBULATORY_CARE_PROVIDER_SITE_OTHER): Payer: BC Managed Care – PPO | Admitting: Internal Medicine

## 2020-11-20 VITALS — BP 108/72 | HR 73 | Temp 98.3°F | Ht 67.75 in | Wt 192.8 lb

## 2020-11-20 DIAGNOSIS — Z1211 Encounter for screening for malignant neoplasm of colon: Secondary | ICD-10-CM

## 2020-11-20 DIAGNOSIS — Z124 Encounter for screening for malignant neoplasm of cervix: Secondary | ICD-10-CM | POA: Insufficient documentation

## 2020-11-20 DIAGNOSIS — Z0000 Encounter for general adult medical examination without abnormal findings: Secondary | ICD-10-CM | POA: Diagnosis not present

## 2020-11-20 DIAGNOSIS — N750 Cyst of Bartholin's gland: Secondary | ICD-10-CM

## 2020-11-20 DIAGNOSIS — M25552 Pain in left hip: Secondary | ICD-10-CM | POA: Diagnosis not present

## 2020-11-20 DIAGNOSIS — M25551 Pain in right hip: Secondary | ICD-10-CM

## 2020-11-20 DIAGNOSIS — R294 Clicking hip: Secondary | ICD-10-CM

## 2020-11-20 DIAGNOSIS — D509 Iron deficiency anemia, unspecified: Secondary | ICD-10-CM | POA: Diagnosis not present

## 2020-11-20 DIAGNOSIS — E039 Hypothyroidism, unspecified: Secondary | ICD-10-CM | POA: Diagnosis not present

## 2020-11-21 LAB — CYTOLOGY - PAP
Comment: NEGATIVE
Diagnosis: NEGATIVE
High risk HPV: NEGATIVE

## 2020-11-21 NOTE — Assessment & Plan Note (Signed)
Thyroid was underactive on  synthroid dose of 50 mcg daily and was increased last September  to 75 mcg . Repeat level is normal  Lab Results  Component Value Date   TSH 3.25 09/19/2020

## 2020-11-21 NOTE — Progress Notes (Addendum)
Patient ID: Christy Fernandez, female    DOB: [birth year]  Age: 50 y.o. MRN: [id number]  The patient is here for annual preventive  examination and management of other chronic and acute problems.   The risk factors are reflected in the social history.  The roster of all physicians providing medical care to patient - is listed in the Snapshot section of the chart.  Activities of daily living:  The patient is 100% independent in all ADLs: dressing, toileting, feeding as well as independent mobility  Home safety : The patient has smoke detectors in the home. They wear seatbelts.  There are no firearms at home. There is no violence in the home.   There is no risks for hepatitis, STDs or HIV. There is no   history of blood transfusion. They have no travel history to infectious disease endemic areas of the world.  The patient has seen their dentist in the last six month. They have seen their eye doctor in the last year. She denies hearing difficulty with regard to whispered voices and some television programs.  She has deferred audiologic testing in the last year.  They do not  have excessive sun exposure. Discussed the need for sun protection: hats, long sleeves and use of sunscreen if there is significant sun exposure.   Diet: the importance of a healthy diet is discussed. She does have a healthy diet.  The benefits of regular aerobic exercise were discussed. She has been exercising 4 times per week and has developed low back pain radiating to her left SI joint. inutes.   Depression screen: there are no signs or vegative symptoms of depression- irritability, change in appetite, anhedonia, sadness/tearfullness.  The following portions of the patient's history were reviewed and updated as appropriate: allergies, current medications, past family history, past medical history,  past surgical history, past social history  and problem list.  Visual acuity was not assessed per patient preference since she  has regular follow up with her ophthalmologist. Hearing and body mass index were assessed and reviewed.   During the course of the visit the patient was educated and counseled about appropriate screening and preventive services including : fall prevention , diabetes screening, nutrition counseling, colorectal cancer screening, and recommended immunizations.    CC: The primary encounter diagnosis was Routine general medical examination at a health care facility. Diagnoses of Hip pain, bilateral, Clicking of both hips, Screening for cervical cancer, Iron deficiency anemia, unspecified iron deficiency anemia type, Colon cancer screening, Acquired hypothyroidism, Cervical cancer screening, and Bartholin's gland cyst were also pertinent to this visit.  Bilateral hip discomfort, certain movements cause both hips to "click" Has tried resuming yoga ,  stretching   History  Katurah has a past medical history of Migraine.   She has a past surgical history that includes septoplasty and LEEP (2002).   Her family history includes Cancer in her maternal grandfather, maternal grandmother, and paternal grandmother; Diabetes in her paternal grandfather.She reports that she has never smoked. She has never used smokeless tobacco. She reports current alcohol use. She reports that she does not use drugs.  Outpatient Medications Prior to Visit  Medication Sig Dispense Refill   Butalbital-APAP-Caffeine 50-300-40 MG CAPS TAKE 1-2 CAPSULES BY MOUTH EVERY 6 8 HOURS AS NEEDED FOR HEADACHE 84 capsule 2   eletriptan (RELPAX) 20 MG tablet Take 1 tablet (20 mg total) by mouth as needed for migraine or headache. May repeat in 2 hours if headache persists or recurs. 10 tablet  0   levothyroxine (SYNTHROID) 75 MCG tablet TAKE 1 TABLET BY MOUTH EVERY DAY 90 tablet 0   Magnesium 250 MG TABS Take 1 tablet by mouth daily.     mometasone (NASONEX) 50 MCG/ACT nasal spray Place 2 sprays into the nose daily. 17 g 11   Multiple Vitamin  (MULTIVITAMIN) tablet Take 1 tablet by mouth daily.     Omega 3 1200 MG CAPS Take 1 capsule by mouth daily.     propranolol ER (INDERAL LA) 60 MG 24 hr capsule Take 1 capsule (60 mg total) by mouth daily. 30 capsule 3   SUMAtriptan (IMITREX) 100 MG tablet TAKE 1 TAB BY MOUTH ONCE FOR 1 DOSE MAY REPEAT IN 2 HRS IF HEADACHE PERSIST OR RECURS MAX DOSE 2 TAB 9 tablet 5   tiZANidine (ZANAFLEX) 4 MG tablet Take 1 tablet (4 mg total) by mouth every 6 (six) hours as needed for muscle spasms. 30 tablet 0   triamcinolone ointment (KENALOG) 0.1 % SMARTSIG:1 Topical Daily PRN     Vitamin D, Ergocalciferol, (DRISDOL) 1.25 MG (50000 UNIT) CAPS capsule TAKE 1 CAPSULE BY MOUTH ONE TIME PER WEEK 4 capsule 0   Coenzyme Q10 (COQ10) 200 MG CAPS Take 1 capsule by mouth daily. (Patient not taking: Reported on 11/20/2020)     diazepam (VALIUM) 10 MG tablet Take 1 tablet (10 mg total) by mouth every 12 (twelve) hours as needed for anxiety. (Patient not taking: Reported on 11/20/2020) 20 tablet 0   meloxicam (MOBIC) 15 MG tablet Take 15 mg by mouth daily. (Patient not taking: Reported on 11/20/2020)     spironolactone (ALDACTONE) 50 MG tablet Take 1 tablet (50 mg total) by mouth daily. As needed for fluid retention 90 tablet 3   No facility-administered medications prior to visit.    Review of Systems  Patient denies headache, fevers, malaise, unintentional weight loss, skin rash, eye pain, sinus congestion and sinus pain, sore throat, dysphagia,  hemoptysis , cough, dyspnea, wheezing, chest pain, palpitations, orthopnea, edema, abdominal pain, nausea, melena, diarrhea, constipation, flank pain, dysuria, hematuria, urinary  Frequency, nocturia, numbness, tingling, seizures,  Focal weakness, Loss of consciousness,  Tremor, insomnia, depression, anxiety, and suicidal ideation.     Objective:  BP 108/72   Pulse 73   Temp 98.3 F (36.8 C)   Ht 5' 7.75" (1.721 m)   Wt 192 lb 12.8 oz (87.5 kg)   SpO2 99%   BMI 29.53 kg/m    Physical Exam  General Appearance:    Alert, cooperative, no distress, appears stated age  Head:    Normocephalic, without obvious abnormality, atraumatic  Eyes:    PERRL, conjunctiva/corneas clear, EOM's intact, fundi    benign, both eyes  Ears:    Normal TM's and external ear canals, both ears  Nose:   Nares normal, septum midline, mucosa normal, no drainage    or sinus tenderness  Throat:   Lips, mucosa, and tongue normal; teeth and gums normal  Neck:   Supple, symmetrical, trachea midline, no adenopathy;    thyroid:  no enlargement/tenderness/nodules; no carotid   bruit or JVD  Back:     Symmetric, no curvature, ROM normal, no CVA tenderness  Lungs:     Clear to auscultation bilaterally, respirations unlabored  Chest Wall:    No tenderness or deformity   Heart:    Regular rate and rhythm, S1 and S2 normal, no murmur, rub   or gallop  Breast Exam:    No tenderness, masses, or  nipple abnormality  Abdomen:     Soft, non-tender, bowel sounds active all four quadrants,    no masses, no organomegaly  Genitalia:    Pelvic: cervix normal in appearance, right  labia minora with large nontender swelling vs cyst,  no adnexal masses or tenderness, no cervical motion tenderness, rectovaginal septum normal, uterus normal size, shape, and consistency and vagina normal without discharge  Extremities:   Extremities normal, atraumatic, no cyanosis or edema.  Full ROM both hips,  negative straight leg lifts bilaterally  Pulses:   2+ and symmetric all extremities  Skin:   Skin color, texture, turgor normal, no rashes or lesions  Lymph nodes:   Cervical, supraclavicular, and axillary nodes normal  Neurologic:   CNII-XII intact, normal strength, sensation and reflexes    throughout    Assessment & Plan:   Problem List Items Addressed This Visit       Unprioritized   Acquired hypothyroidism    Thyroid was underactive on  synthroid dose of 50 mcg daily and was increased last September  to 75 mcg  . Repeat level is normal  Lab Results  Component Value Date   TSH 3.25 09/19/2020         Bartholin's gland cyst    Referral to GYN for evaluation in progress      Cervical cancer screening    PAP smear was normal on today's screening       Hip pain, bilateral    Plain films done today were normal appearing to me but have not been read yet by radiology. ( Lumbar spine films from 2 weeks ago reviewed:  degenerative changes of facet joints most motable at L5-S1)  . If hip  films are normal will offer PT       Relevant Orders   DG HIPS BILAT W OR W/O PELVIS 3-4 VIEWS (Completed)   Iron deficiency anemia    Persistent.  Still menstruating,  But periods last only 5 days.  No prior colonsocopy.Marland Kitchen  Referral in progress       Routine general medical examination at a health care facility - Primary    age appropriate education and counseling updated, referrals for preventative services and immunizations addressed, dietary and smoking counseling addressed, most recent labs reviewed.  I have personally reviewed and have noted:   1) the patient's medical and social history 2) The pt's use of alcohol, tobacco, and illicit drugs 3) The patient's current medications and supplements 4) Functional ability including ADL's, fall risk, home safety risk, hearing and visual impairment 5) Diet and physical activities 6) Evidence for depression or mood disorder 7) The patient's height, weight, and BMI have been recorded in the chart   I have made referrals, and provided counseling and education based on review of the above      Other Visit Diagnoses     Clicking of both hips       Relevant Orders   DG HIPS BILAT W OR W/O PELVIS 3-4 VIEWS (Completed)   Screening for cervical cancer       Relevant Orders   Cytology - PAP( [place]) (Completed)   Colon cancer screening       Relevant Orders   Ambulatory referral to Gastroenterology       I am having Chipper [place]. Carlota Raspberry maintain her  multivitamin, mometasone, diazepam, spironolactone, Omega 3, Magnesium, CoQ10, propranolol ER, tiZANidine, eletriptan, Butalbital-APAP-Caffeine, Vitamin D (Ergocalciferol), SUMAtriptan, levothyroxine, triamcinolone ointment, and meloxicam.  No orders of the defined  types were placed in this encounter.   There are no discontinued medications.  Follow-up: No follow-ups on file.   Crecencio Mc, MD

## 2020-11-21 NOTE — Assessment & Plan Note (Signed)

## 2020-11-21 NOTE — Assessment & Plan Note (Signed)
PAP smear was normal on today's screening

## 2020-11-21 NOTE — Assessment & Plan Note (Addendum)
Plain films done today were normal appearing to me but have not been read yet by radiology. ( Lumbar spine films from 2 weeks ago reviewed:  degenerative changes of facet joints most motable at L5-S1)  . If hip  films are normal will offer PT

## 2020-11-21 NOTE — Assessment & Plan Note (Signed)
Persistent.  Still menstruating,  But periods last only 5 days.  No prior colonsocopy.Marland Kitchen  Referral in progress

## 2020-11-22 ENCOUNTER — Other Ambulatory Visit: Payer: Self-pay

## 2020-11-22 ENCOUNTER — Telehealth: Payer: Self-pay | Admitting: Internal Medicine

## 2020-11-22 DIAGNOSIS — Z800 Family history of malignant neoplasm of digestive organs: Secondary | ICD-10-CM

## 2020-11-22 DIAGNOSIS — Z1211 Encounter for screening for malignant neoplasm of colon: Secondary | ICD-10-CM

## 2020-11-22 DIAGNOSIS — N750 Cyst of Bartholin's gland: Secondary | ICD-10-CM

## 2020-11-22 MED ORDER — NA SULFATE-K SULFATE-MG SULF 17.5-3.13-1.6 GM/177ML PO SOLN
1.0000 | Freq: Once | ORAL | 0 refills | Status: AC
Start: 2020-11-22 — End: 2020-11-22

## 2020-11-22 NOTE — Assessment & Plan Note (Signed)
Gyn referral for right sided cyst suspected to be bartholin's gland related

## 2020-11-22 NOTE — Assessment & Plan Note (Signed)
Referral to GYN for evaluation in progress

## 2020-11-22 NOTE — Addendum Note (Signed)
Addended by: Crecencio Mc on: 11/22/2020 04:00 PM   Modules accepted: Orders

## 2020-11-22 NOTE — Telephone Encounter (Signed)
I spoke with pt and pt mentioned a referral to Ob/gyn regarding a cyst. Please advise and Thank you!  Call pt @ [phone].

## 2020-11-22 NOTE — Telephone Encounter (Signed)
Pt is aware that referral has been placed.  

## 2020-11-22 NOTE — Progress Notes (Signed)
Gastroenterology Pre-Procedure Review  Request Date: 12/27/20 Requesting Physician: Dr. Bonna Gains  PATIENT REVIEW QUESTIONS: The patient responded to the following health history questions as indicated:    1. Are you having any GI issues? no 2. Do you have a personal history of Polyps? no 3. Do you have a family history of Colon Cancer or Polyps? yes (Maternal Grandmother colon cancer) 4. Diabetes Mellitus? no 5. Joint replacements in the past 12 months?no 6. Major health problems in the past 3 months?no 7. Any artificial heart valves, MVP, or defibrillator?no    MEDICATIONS & ALLERGIES:    Patient reports the following regarding taking any anticoagulation/antiplatelet therapy:   Plavix, Coumadin, Eliquis, Xarelto, Lovenox, Pradaxa, Brilinta, or Effient? no Aspirin? no  Patient confirms/reports the following medications:  Current Outpatient Medications  Medication Sig Dispense Refill   Butalbital-APAP-Caffeine 50-300-40 MG CAPS TAKE 1-2 CAPSULES BY MOUTH EVERY 6 8 HOURS AS NEEDED FOR HEADACHE 84 capsule 2   Coenzyme Q10 (COQ10) 200 MG CAPS Take 1 capsule by mouth daily. (Patient not taking: Reported on 11/20/2020)     diazepam (VALIUM) 10 MG tablet Take 1 tablet (10 mg total) by mouth every 12 (twelve) hours as needed for anxiety. (Patient not taking: Reported on 11/20/2020) 20 tablet 0   eletriptan (RELPAX) 20 MG tablet Take 1 tablet (20 mg total) by mouth as needed for migraine or headache. May repeat in 2 hours if headache persists or recurs. 10 tablet 0   levothyroxine (SYNTHROID) 75 MCG tablet TAKE 1 TABLET BY MOUTH EVERY DAY 90 tablet 0   Magnesium 250 MG TABS Take 1 tablet by mouth daily.     meloxicam (MOBIC) 15 MG tablet Take 15 mg by mouth daily. (Patient not taking: Reported on 11/20/2020)     mometasone (NASONEX) 50 MCG/ACT nasal spray Place 2 sprays into the nose daily. 17 g 11   Multiple Vitamin (MULTIVITAMIN) tablet Take 1 tablet by mouth daily.     Omega 3 1200 MG CAPS  Take 1 capsule by mouth daily.     propranolol ER (INDERAL LA) 60 MG 24 hr capsule Take 1 capsule (60 mg total) by mouth daily. 30 capsule 3   spironolactone (ALDACTONE) 50 MG tablet Take 1 tablet (50 mg total) by mouth daily. As needed for fluid retention 90 tablet 3   SUMAtriptan (IMITREX) 100 MG tablet TAKE 1 TAB BY MOUTH ONCE FOR 1 DOSE MAY REPEAT IN 2 HRS IF HEADACHE PERSIST OR RECURS MAX DOSE 2 TAB 9 tablet 5   tiZANidine (ZANAFLEX) 4 MG tablet Take 1 tablet (4 mg total) by mouth every 6 (six) hours as needed for muscle spasms. 30 tablet 0   triamcinolone ointment (KENALOG) 0.1 % SMARTSIG:1 Topical Daily PRN     Vitamin D, Ergocalciferol, (DRISDOL) 1.25 MG (50000 UNIT) CAPS capsule TAKE 1 CAPSULE BY MOUTH ONE TIME PER WEEK 4 capsule 0   No current facility-administered medications for this visit.    Patient confirms/reports the following allergies:  Allergies  Allergen Reactions   Latex     No orders of the defined types were placed in this encounter.   AUTHORIZATION INFORMATION Primary Insurance: 1D#: Group #:  Secondary Insurance: 1D#: Group #:  SCHEDULE INFORMATION: Date: 12/27/20 Time: Location: [place]

## 2020-11-25 DIAGNOSIS — Z0189 Encounter for other specified special examinations: Secondary | ICD-10-CM | POA: Diagnosis not present

## 2020-11-25 LAB — CBC AND DIFFERENTIAL
HCT: 37 (ref 36–46)
Hemoglobin: 11.5 — AB (ref 12.0–16.0)
Neutrophils Absolute: 2.1
Platelets: 392 (ref 150–399)
WBC: 3.9

## 2020-11-25 LAB — IRON,TIBC AND FERRITIN PANEL: Ferritin: 14

## 2020-11-25 LAB — CBC: RBC: 4.43 (ref 3.87–5.11)

## 2020-11-26 DIAGNOSIS — D649 Anemia, unspecified: Secondary | ICD-10-CM | POA: Diagnosis not present

## 2020-11-27 ENCOUNTER — Other Ambulatory Visit: Payer: Self-pay

## 2020-11-27 ENCOUNTER — Encounter: Payer: Self-pay | Admitting: Obstetrics & Gynecology

## 2020-11-27 ENCOUNTER — Ambulatory Visit (INDEPENDENT_AMBULATORY_CARE_PROVIDER_SITE_OTHER): Payer: BC Managed Care – PPO | Admitting: Obstetrics & Gynecology

## 2020-11-27 VITALS — BP 100/70 | Ht 68.0 in | Wt 194.0 lb

## 2020-11-27 DIAGNOSIS — N750 Cyst of Bartholin's gland: Secondary | ICD-10-CM

## 2020-11-27 NOTE — Patient Instructions (Signed)
Bartholin's Cyst A Bartholin's cyst is a fluid-filled sac that forms on a Bartholin's gland. Bartholin's glands are small glands in the folds of skin near the opening of the vagina (labia). This type of cyst causes a bulge or lump near the opening of the vagina. If you have a cyst that is small and not infected, you may be able to take care of it at home. If your cyst gets infected, it may cause pain and your doctor may need to drain it. What are the causes? This condition may be caused by a blocked Bartholin's gland. Germs (bacteria) inside of the cyst can cause an infection. What are the signs or symptoms? A bulge or lump near the opening of the vagina. Discomfort or pain. Redness, swelling, or fluid draining from the area. How is this treated? You may not need treatment if your cyst is not causing symptoms. The cyst can go away on its own with home care. Home care includes hot baths or heat therapy. Large cysts or cysts that are infected may be treated with: Antibiotic medicine. A procedure to drain the fluid. Cysts that keep coming back will need to be drained many times. Your doctor may talk to you about surgery to remove the cyst. Follow these instructions at home: Medicines Take over-the-counter and prescription medicines only as told by your doctor. If you were prescribed an antibiotic medicine, take it as told by your doctor. Do not stop taking it even if you start to feel better. Managing pain and swelling Try sitz baths to help with pain and swelling. A sitz bath is a warm water bath in which the water only comes up to your hips and should cover your buttocks. You may take sitz baths a few times a day. If told, put heat on the affected area as often as needed. Use the heat source that your doctor recommends, such as a moist heat pack or a heating pad. Place a towel between your skin and the heat source. Leave the heat on for 20-30 minutes. Take off the heat if your skin turns  bright red. This is very important. If you cannot feel pain, heat, or cold, you have a greater risk of getting burned. General instructions If your cyst was drained: Follow instructions from your doctor about how to take care of your wound. Use feminine pads to absorb any fluid. Do not push on or squeeze your cyst. Do not have sex until the cyst has gone away or your wound from drainage has healed. Take these steps to help prevent a cyst from returning, and to prevent other cysts from forming: Take a bath or shower once a day. Clean the area around your vagina with mild soap and water when you bathe. Practice safe sex to prevent STIs. Talk with your doctor about how to prevent STIs and which forms of birth control to use. Keep all follow-up visits. Contact a doctor if: You have a fever. You get more redness, swelling, or pain around your cyst. You have fluid, blood, pus, or a bad smell coming from your cyst. You have a cyst that gets larger or a cyst that comes back. Summary A Bartholin's cyst is a fluid-filled sac that forms on a Bartholin's gland. These small glands are found in the folds of skin near the opening of the vagina (labia). This type of cyst causes a bulge or lump near the opening of the vagina. Try sitz baths a few times a day to help  with pain and swelling. Do not push on or squeeze your cyst. This information is not intended to replace advice given to you by your health care provider. Make sure you discuss any questions you have with your health care provider. Document Revised: 07/10/2019 Document Reviewed: 07/10/2019 Elsevier Patient Education  [employer].

## 2020-11-27 NOTE — Progress Notes (Signed)
Consultant: Dr Derrel Nip Reason: Bartholin Cyst  Patient presents for evaluation of a right sided vulvar cyst diagnosed at PCP wellness visit recently.  She reports prior h/o left sided painful Bartholins Cyst in 2002.  Since that time, she has noted any sx's again of this type of cyst or abscess.  Even recently, she denies pain, drainage, bleeding, fever, dyspareunia.  She reports reg cycles, with somewhat heavier flow of recent note.  No other menopause sx's.  She has prior NSVD 11 yrs ago (daughter).  No h/o endometriosis, fibroids, other Gyn concerns.  Works at [employer].  Recent treatments for anemia, hypothyroidism.  PMHx: She  has a past medical history of Migraine. Also,  has a past surgical history that includes septoplasty and LEEP (2002)., family history includes Cancer in her maternal grandfather, maternal grandmother, and paternal grandmother; Diabetes in her paternal grandfather.,  reports that she has never smoked. She has never used smokeless tobacco. She reports current alcohol use. She reports that she does not use drugs.  She has a current medication list which includes the following prescription(s): butalbital-apap-caffeine, coq10, diazepam, eletriptan, levothyroxine, magnesium, meloxicam, mometasone, multivitamin, omega 3, propranolol er, sumatriptan, tizanidine, triamcinolone ointment, vitamin d (ergocalciferol), and spironolactone. Also, is allergic to latex.  Review of Systems  Constitutional:  Negative for chills, fever and malaise/fatigue.  HENT:  Negative for congestion, sinus pain and sore throat.   Eyes:  Negative for blurred vision and pain.  Respiratory:  Negative for cough and wheezing.   Cardiovascular:  Negative for chest pain and leg swelling.  Gastrointestinal:  Negative for abdominal pain, constipation, diarrhea, heartburn, nausea and vomiting.  Genitourinary:  Negative for dysuria, frequency, hematuria and urgency.  Musculoskeletal:  Negative for back  pain, joint pain, myalgias and neck pain.  Skin:  Negative for itching and rash.  Neurological:  Negative for dizziness, tremors and weakness.  Endo/Heme/Allergies:  Does not bruise/bleed easily.  Psychiatric/Behavioral:  Negative for depression. The patient is not nervous/anxious and does not have insomnia.    Objective: BP 100/70   Ht 5\' 8"  (1.727 m)   Wt 194 lb (88 kg)   LMP 11/22/2020   BMI 29.50 kg/m  Physical Exam Constitutional:      General: She is not in acute distress.    Appearance: She is well-developed.  Genitourinary:     Right Labia: Bartholin's cyst.     Right Labia: No rash or tenderness.    Left Labia: No tenderness or rash.    Vulva exam comments: Right cyst is 2 cm, NT, soft, mobile, no drainage.     No vaginal erythema or bleeding.      Right Adnexa: not tender and no mass present.    Left Adnexa: not tender and no mass present.    No cervical motion tenderness, discharge, polyp or nabothian cyst.     Uterus is not enlarged.     No uterine mass detected.    Uterus is midaxial.     Pelvic exam was performed with patient in the lithotomy position.  HENT:     Head: Normocephalic and atraumatic.     Nose: Nose normal.  Abdominal:     General: There is no distension.     Palpations: Abdomen is soft.     Tenderness: There is no abdominal tenderness.  Musculoskeletal:        General: Normal range of motion.  Neurological:     Mental Status: She is alert and oriented to person, place, and  time.     Cranial Nerves: No cranial nerve deficit.  Skin:    General: Skin is warm and dry.  Psychiatric:        Attention and Perception: Attention normal.        Mood and Affect: Mood and affect normal.        Speech: Speech normal.        Behavior: Behavior normal.        Thought Content: Thought content normal.        Judgment: Judgment normal.    ASSESSMENT/PLAN:   Problem List Items Addressed This Visit       Genitourinary   Bartholin's gland cyst -  Primary  Discussed treatment options for Bartholins cyst or abscess, if becomes symptomatic.  At this time, she is not, and there is no need for ABX or lancing or surgery.  Will monitor over time with her.  There is no known preventative medicine, hygienic measures, or topicals to help.  Info given to patient.  PAP UTD, normal 2022. MMG UTD, yearly. Planning colonoscopy in Nov.  Barnett Applebaum, MD, [facility], [facility] 11/27/2020  9:36 AM

## 2020-12-09 ENCOUNTER — Telehealth: Payer: Self-pay | Admitting: Internal Medicine

## 2020-12-09 NOTE — Telephone Encounter (Signed)
Your hemoglobin is normal,  yo uare not anemic  Regards,   Deborra Medina, MD

## 2020-12-11 IMAGING — MR MR HEAD W/O CM
11 of 12 series · 44 of 48 positions shown · non-contrast
Comparison: None.

CLINICAL DATA: Increased headache frequency, now daily. Family
history of brain tumor.

EXAM:
MRI HEAD WITHOUT CONTRAST
TECHNIQUE: Multiplanar, multiecho pulse sequences of the brain and surrounding
structures were obtained without intravenous contrast.

[Series 5: DWI · axial · 3.0mm · 0.88mm/px · z∈[-82,+63]mm · 10 of 100 slices shown (1 of 4)]
[im 1/100]
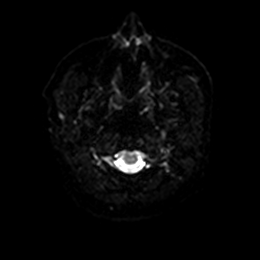
[im 12/100]
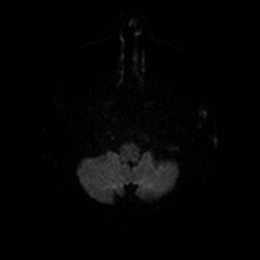
[im 23/100]
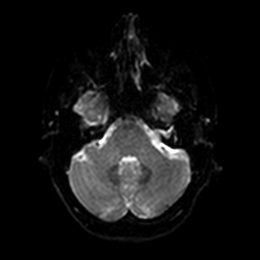
[im 34/100]
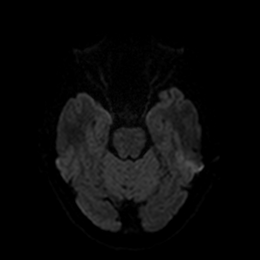
[im 45/100]
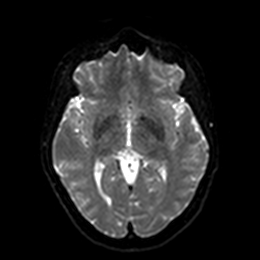
[im 56/100]
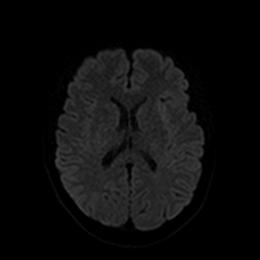
[im 67/100]
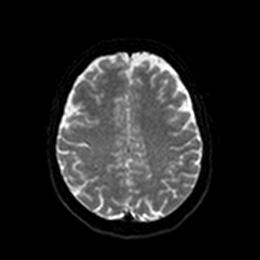
[im 78/100]
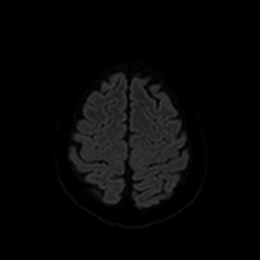
[im 89/100]
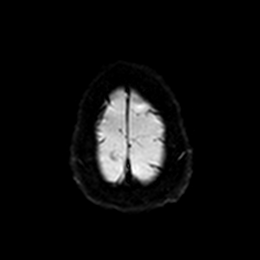
[im 100/100]
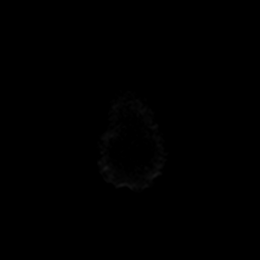

[Series 6: DWI · axial · 3.0mm · 0.88mm/px · z∈[-82,+63]mm · 4 of 50 slices shown (2 of 4)]
[im 1/50]
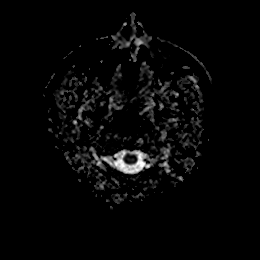
[im 17/50]
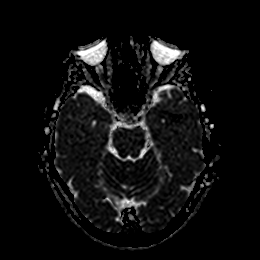
[im 33/50]
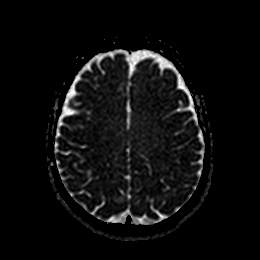
[im 50/50]
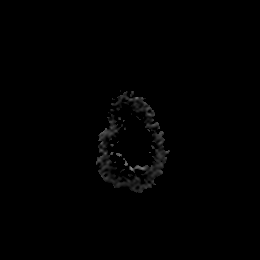

[Series 7: DWI · coronal · 4.0mm · 0.88mm/px · 6 of 72 slices shown (3 of 4)]
[im 1/72]
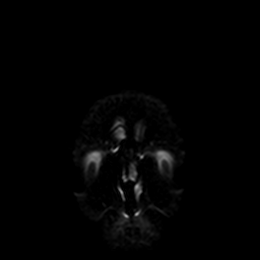
[im 15/72]
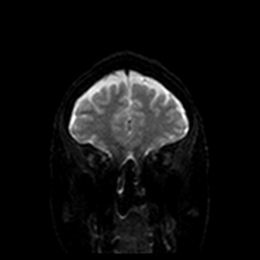
[im 29/72]
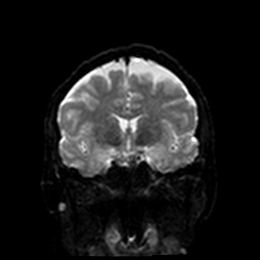
[im 43/72]
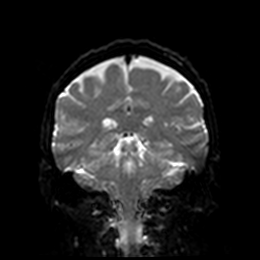
[im 57/72]
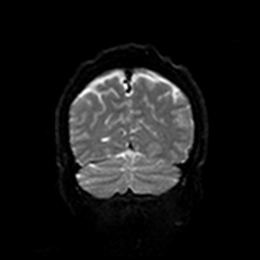
[im 72/72]
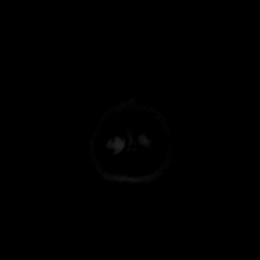

[Series 8: DWI · coronal · 4.0mm · 0.88mm/px · 3 of 36 slices shown (4 of 4)]
[im 1/36]
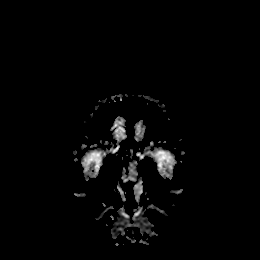
[im 18/36]
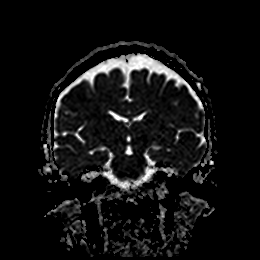
[im 36/36]
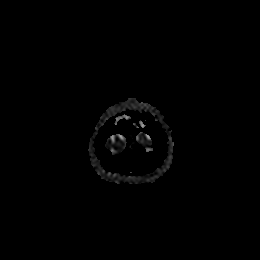

[Series 9: T1 · sagittal · 5.0mm · 0.75mm/px · 2 of 25 slices shown]
[im 1/25]
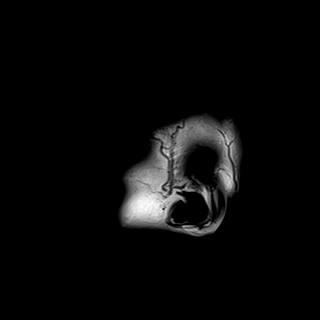
[im 25/25]
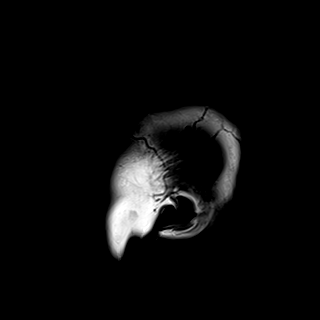

[Series 10: T2 · axial · 5.0mm · 0.72mm/px · z∈[-86,+68]mm · 2 of 27 slices shown (1 of 2)]
[im 1/27]
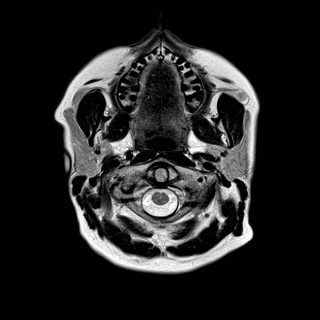
[im 27/27]
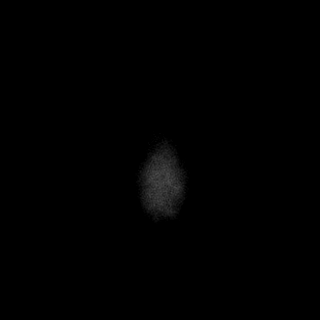

[Series 11: FLAIR · axial · 5.0mm · 0.45mm/px · z∈[-88,+66]mm · 2 of 27 slices shown]
[im 1/27]
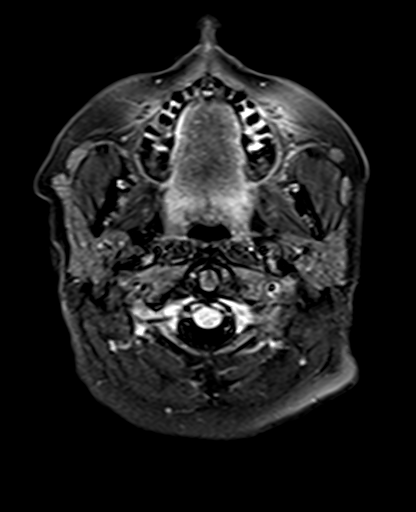
[im 27/27]
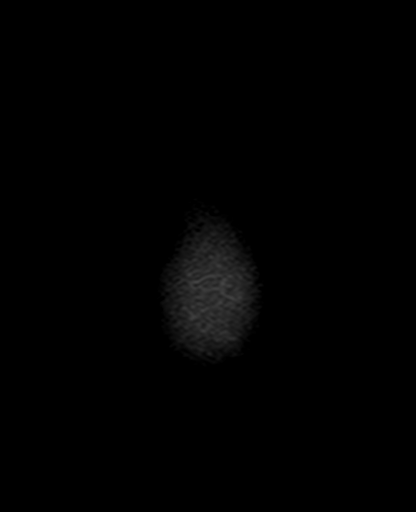

[Series 12: mag_images · axial · 3.0mm · 0.90mm/px · z∈[-82,+57]mm · 4 of 48 slices shown]
[im 1/48]
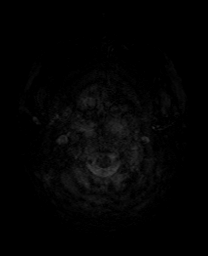
[im 16/48]
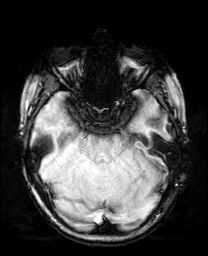
[im 32/48]
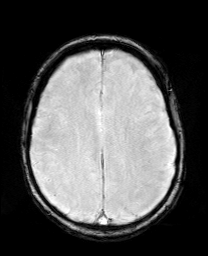
[im 48/48]
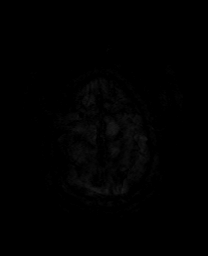

[Series 13: pha_images · axial · 3.0mm · 0.90mm/px · z∈[-82,+57]mm · 4 of 48 slices shown]
[im 1/48]
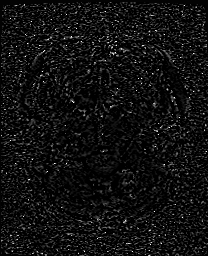
[im 16/48]
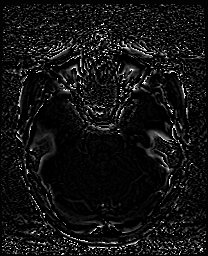
[im 32/48]
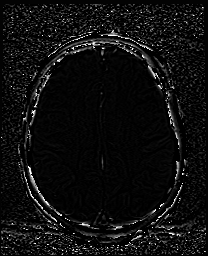
[im 48/48]
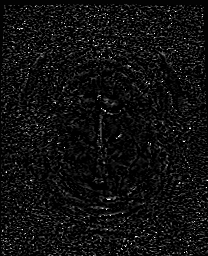

[Series 14: swi_images · axial · 3.0mm · 0.90mm/px · z∈[-82,+57]mm · 4 of 48 slices shown]
[im 1/48]
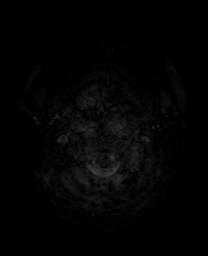
[im 16/48]
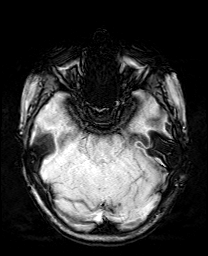
[im 32/48]
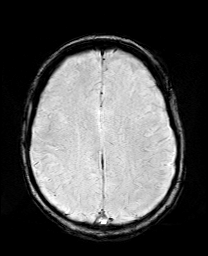
[im 48/48]
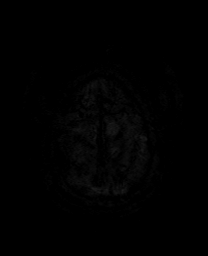

[Series 17: T2 · coronal · 5.0mm · 0.72mm/px · 3 of 32 slices shown (2 of 2)]
[im 1/32]
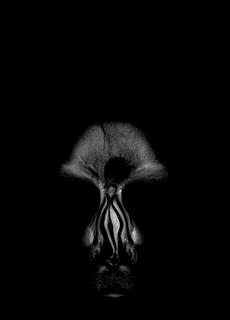
[im 16/32]
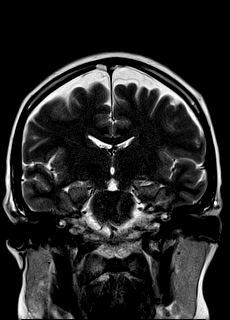
[im 32/32]
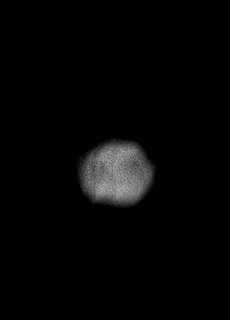

[44 of 48 positions shown; findings below may reference images not displayed]

FINDINGS: Brain: There is no evidence of acute infarct, intracranial
hemorrhage, mass, midline shift, or extra-axial fluid collection.
The ventricles and sulci are normal. The brain is normal in signal.
The cerebellar tonsils are normally position. The pituitary gland is
normal in size.

Vascular: Major intracranial vascular flow voids are preserved.

Skull and upper cervical spine: Unremarkable bone marrow signal.

Sinuses/Orbits: Unremarkable orbits. Paranasal sinuses and mastoid
air cells are clear.

Other: None.
IMPRESSION: Negative brain MRI.

## 2020-12-19 ENCOUNTER — Other Ambulatory Visit: Payer: Self-pay

## 2020-12-26 ENCOUNTER — Other Ambulatory Visit: Payer: Self-pay

## 2020-12-26 ENCOUNTER — Other Ambulatory Visit: Payer: Self-pay | Admitting: Gastroenterology

## 2020-12-26 MED ORDER — PEG 3350-KCL-NA BICARB-NACL 420 G PO SOLR: 4000.0000 mL | Freq: Once | ORAL | 0 refills | Status: AC

## 2020-12-26 MED ORDER — NA SULFATE-K SULFATE-MG SULF 17.5-3.13-1.6 GM/177ML PO SOLN: 1.0000 | Freq: Once | ORAL | 0 refills | Status: AC

## 2020-12-26 NOTE — Progress Notes (Signed)
Cheaper prep sent to pharmacy of choice.

## 2020-12-26 NOTE — Progress Notes (Signed)
Prep resent to pharmacy.

## 2020-12-27 ENCOUNTER — Ambulatory Visit: Payer: BC Managed Care – PPO | Admitting: Certified Registered"

## 2020-12-27 ENCOUNTER — Encounter: Payer: Self-pay | Admitting: Gastroenterology

## 2020-12-27 ENCOUNTER — Encounter
Admission: RE | Disposition: A | Discharge status: Home / Self Care | Payer: Self-pay | Source: Home / Self Care | Attending: Gastroenterology

## 2020-12-27 ENCOUNTER — Ambulatory Visit
Admission: RE | Admit: 2020-12-27 | Discharge: 2020-12-27 | Disposition: A | Discharge status: Home / Self Care | Payer: BC Managed Care – PPO | Attending: Gastroenterology | Admitting: Gastroenterology

## 2020-12-27 ENCOUNTER — Other Ambulatory Visit: Payer: Self-pay

## 2020-12-27 DIAGNOSIS — D124 Benign neoplasm of descending colon: Secondary | ICD-10-CM | POA: Diagnosis not present

## 2020-12-27 DIAGNOSIS — Z1211 Encounter for screening for malignant neoplasm of colon: Secondary | ICD-10-CM

## 2020-12-27 DIAGNOSIS — Z833 Family history of diabetes mellitus: Secondary | ICD-10-CM | POA: Diagnosis not present

## 2020-12-27 DIAGNOSIS — K635 Polyp of colon: Secondary | ICD-10-CM

## 2020-12-27 DIAGNOSIS — Z79899 Other long term (current) drug therapy: Secondary | ICD-10-CM | POA: Diagnosis not present

## 2020-12-27 DIAGNOSIS — Z791 Long term (current) use of non-steroidal anti-inflammatories (NSAID): Secondary | ICD-10-CM | POA: Diagnosis not present

## 2020-12-27 DIAGNOSIS — D125 Benign neoplasm of sigmoid colon: Secondary | ICD-10-CM | POA: Insufficient documentation

## 2020-12-27 DIAGNOSIS — D123 Benign neoplasm of transverse colon: Secondary | ICD-10-CM | POA: Insufficient documentation

## 2020-12-27 DIAGNOSIS — Z8371 Family history of colonic polyps: Secondary | ICD-10-CM | POA: Diagnosis not present

## 2020-12-27 DIAGNOSIS — Z91040 Latex allergy status: Secondary | ICD-10-CM | POA: Insufficient documentation

## 2020-12-27 DIAGNOSIS — Z7951 Long term (current) use of inhaled steroids: Secondary | ICD-10-CM | POA: Insufficient documentation

## 2020-12-27 DIAGNOSIS — Z800 Family history of malignant neoplasm of digestive organs: Secondary | ICD-10-CM | POA: Insufficient documentation

## 2020-12-27 HISTORY — DX: Hypothyroidism, unspecified: E03.9

## 2020-12-27 HISTORY — PX: COLONOSCOPY WITH PROPOFOL: SHX5780

## 2020-12-27 LAB — POCT PREGNANCY, URINE: Preg Test, Ur: NEGATIVE

## 2020-12-27 SURGERY — COLONOSCOPY WITH PROPOFOL
Anesthesia: General

## 2020-12-27 MED ORDER — PROPOFOL 500 MG/50ML IV EMUL
INTRAVENOUS | Status: AC
  Filled 2020-12-27: qty 50

## 2020-12-27 MED ORDER — PROPOFOL 10 MG/ML IV BOLUS
INTRAVENOUS | Status: DC | PRN
  Administered 2020-12-27: 20 mg via INTRAVENOUS
  Administered 2020-12-27: 70 mg via INTRAVENOUS
  Administered 2020-12-27: 10 mg via INTRAVENOUS

## 2020-12-27 MED ORDER — PROPOFOL 500 MG/50ML IV EMUL
INTRAVENOUS | Status: DC | PRN
  Administered 2020-12-27: 140 ug/kg/min via INTRAVENOUS

## 2020-12-27 MED ORDER — LIDOCAINE HCL (CARDIAC) PF 100 MG/5ML IV SOSY
PREFILLED_SYRINGE | INTRAVENOUS | Status: DC | PRN
  Administered 2020-12-27: 50 mg via INTRAVENOUS

## 2020-12-27 MED ORDER — SODIUM CHLORIDE 0.9 % IV SOLN: INTRAVENOUS | Status: DC

## 2020-12-27 NOTE — Anesthesia Postprocedure Evaluation (Signed)
Anesthesia Post Note  Patient: Christy Fernandez  Procedure(s) Performed: COLONOSCOPY WITH PROPOFOL  Patient location during evaluation: PACU Anesthesia Type: General Level of consciousness: awake and alert Pain management: pain level controlled Vital Signs Assessment: post-procedure vital signs reviewed and stable Respiratory status: spontaneous breathing, nonlabored ventilation and respiratory function stable Cardiovascular status: blood pressure returned to baseline and stable Postop Assessment: no apparent nausea or vomiting Anesthetic complications: no   No notable events documented.   Last Vitals:  Vitals:   12/27/20 1019 12/27/20 1029  BP: 106/84 122/79  Pulse: 66 68  Resp: 18 17  Temp:    SpO2: 100% 100%    Last Pain:  Vitals:   12/27/20 1029  TempSrc:   PainSc: 0-No pain                 Brett Canales Christy Fernandez

## 2020-12-27 NOTE — Transfer of Care (Signed)
Immediate Anesthesia Transfer of Care Note  Patient: Christy Fernandez  Procedure(s) Performed: COLONOSCOPY WITH PROPOFOL  Patient Location: PACU and Endoscopy Unit  Anesthesia Type:General  Level of Consciousness: drowsy  Airway & Oxygen Therapy: Patient Spontanous Breathing  Post-op Assessment: Report given to RN  Post vital signs: stable  Last Vitals:  Vitals Value Taken Time  BP    Temp    Pulse    Resp    SpO2      Last Pain:  Vitals:   12/27/20 0822  TempSrc: Temporal  PainSc: 0-No pain         Complications: No notable events documented.

## 2020-12-27 NOTE — Anesthesia Preprocedure Evaluation (Signed)
Anesthesia Evaluation  Patient identified by MRN, date of birth, ID band Patient awake    Reviewed: Allergy & Precautions, H&P , NPO status , Patient's Chart, lab work & pertinent test results  History of Anesthesia Complications Negative for: history of anesthetic complications  Airway Mallampati: II  TM Distance: >3 FB Neck ROM: Full    Dental no notable dental hx.    Pulmonary neg pulmonary ROS, neg sleep apnea, neg COPD,    Pulmonary exam normal        Cardiovascular (-) angina(-) Past MI and (-) Cardiac Stents negative cardio ROS Normal cardiovascular exam(-) dysrhythmias      Neuro/Psych  Headaches, PSYCHIATRIC DISORDERS Anxiety negative neurological ROS  negative psych ROS   GI/Hepatic negative GI ROS, Neg liver ROS,   Endo/Other  Hypothyroidism   Renal/GU negative Renal ROS  negative genitourinary   Musculoskeletal   Abdominal   Peds  Hematology negative hematology ROS (+)   Anesthesia Other Findings Past Medical History: No date: Hypothyroidism No date: Migraine  Past Surgical History: 2002: LEEP No date: septoplasty  BMI    Body Mass Index: 28.13 kg/m      Reproductive/Obstetrics negative OB ROS                             Anesthesia Physical Anesthesia Plan  ASA: 2  Anesthesia Plan: General   Post-op Pain Management:    Induction:   PONV Risk Score and Plan: Propofol infusion and TIVA  Airway Management Planned:   Additional Equipment:   Intra-op Plan:   Post-operative Plan:   Informed Consent: I have reviewed the patients History and Physical, chart, labs and discussed the procedure including the risks, benefits and alternatives for the proposed anesthesia with the patient or authorized representative who has indicated his/her understanding and acceptance.     Dental Advisory Given  Plan Discussed with: Anesthesiologist, CRNA and  Surgeon  Anesthesia Plan Comments: (IVGA with native airway)        Anesthesia Quick Evaluation

## 2020-12-27 NOTE — H&P (Signed)
Christy Antigua, MD [address] [place], [place], [place], [address] [address], [place], [place], [place], [address] Phone: [phone]  Fax: [phone]  Primary Care Physician:  Crecencio Mc, MD   Pre-Procedure History & Physical: HPI:  Christy Fernandez is a 50 y.o. female is here for a colonoscopy.   Past Medical History:  Diagnosis Date   Hypothyroidism    Migraine     Past Surgical History:  Procedure Laterality Date   LEEP  2002   septoplasty      Prior to Admission medications   Medication Sig Start Date End Date Taking? Authorizing Provider  Butalbital-APAP-Caffeine 50-300-40 MG CAPS TAKE 1-2 CAPSULES BY MOUTH EVERY 6 8 HOURS AS NEEDED FOR HEADACHE 06/14/19  Yes Crecencio Mc, MD  Coenzyme Q10 (COQ10) 200 MG CAPS Take 1 capsule by mouth daily.   Yes [provider]  Magnesium 250 MG TABS Take 1 tablet by mouth daily.   Yes [provider]  mometasone (NASONEX) 50 MCG/ACT nasal spray Place 2 sprays into the nose daily. 05/24/15  Yes Crecencio Mc, MD  Multiple Vitamin (MULTIVITAMIN) tablet Take 1 tablet by mouth daily.   Yes [provider]  Omega 3 1200 MG CAPS Take 1 capsule by mouth daily.   Yes [provider]  propranolol ER (INDERAL LA) 60 MG 24 hr capsule Take 1 capsule (60 mg total) by mouth daily. 06/14/19  Yes Crecencio Mc, MD  SUMAtriptan (IMITREX) 100 MG tablet TAKE 1 TAB BY MOUTH ONCE FOR 1 DOSE MAY REPEAT IN 2 HRS IF HEADACHE PERSIST OR RECURS MAX DOSE 2 TAB 02/14/20  Yes Crecencio Mc, MD  tiZANidine (ZANAFLEX) 4 MG tablet Take 1 tablet (4 mg total) by mouth every 6 (six) hours as needed for muscle spasms. 06/14/19  Yes Crecencio Mc, MD  triamcinolone ointment (KENALOG) 0.1 % SMARTSIG:1 Topical Daily PRN 11/18/20  Yes [provider]  Vitamin D, Ergocalciferol, (DRISDOL) 1.25 MG (50000 UNIT) CAPS capsule TAKE 1 CAPSULE BY MOUTH ONE TIME PER WEEK 01/17/20  Yes Crecencio Mc, MD  diazepam  (VALIUM) 10 MG tablet Take 1 tablet (10 mg total) by mouth every 12 (twelve) hours as needed for anxiety. Patient not taking: Reported on 12/27/2020 07/20/17   Crecencio Mc, MD  eletriptan (RELPAX) 20 MG tablet Take 1 tablet (20 mg total) by mouth as needed for migraine or headache. May repeat in 2 hours if headache persists or recurs. Patient not taking: Reported on 12/27/2020 06/14/19   Crecencio Mc, MD  levothyroxine (SYNTHROID) 75 MCG tablet TAKE 1 TABLET BY MOUTH EVERY DAY 09/19/20   Crecencio Mc, MD  meloxicam (MOBIC) 15 MG tablet Take 15 mg by mouth daily. Patient not taking: Reported on 12/27/2020 11/18/20   [provider]  spironolactone (ALDACTONE) 50 MG tablet Take 1 tablet (50 mg total) by mouth daily. As needed for fluid retention 08/09/18 08/29/19  Crecencio Mc, MD    Allergies as of 11/22/2020 - Review Complete 11/20/2020  Allergen Reaction Noted   Latex  08/31/2011    Family History  Problem Relation Age of Onset   Cancer Maternal Grandmother        colon   Cancer Maternal Grandfather        colon   Cancer Paternal Grandmother        breast    Diabetes Paternal Grandfather     Social History   Socioeconomic History   Marital  status: Married    Spouse name: Not on file   Number of children: Not on file   Years of education: Not on file   Highest education level: Not on file  Occupational History   Not on file  Tobacco Use   Smoking status: Never   Smokeless tobacco: Never  Vaping Use   Vaping Use: Never used  Substance and Sexual Activity   Alcohol use: Yes    Comment: occ   Drug use: No   Sexual activity: Not on file  Other Topics Concern   Not on file  Social History Narrative   Not on file   Social Determinants of Health   Financial Resource Strain: Not on file  Food Insecurity: Not on file  Transportation Needs: Not on file  Physical Activity: Not on file  Stress: Not on file  Social Connections: Not on file  Intimate Partner  Violence: Not on file    Review of Systems: See HPI, otherwise negative ROS  Physical Exam: Constitutional: General:   Alert,  Well-developed, well-nourished, pleasant and cooperative in NAD BP (!) 124/91   Pulse 75   Temp (!) 97.1 F (36.2 C) (Temporal)   Resp 18   Ht 5\' 8"  (1.727 m)   Wt 83.9 kg   SpO2 100%   BMI 28.13 kg/m   Head: Normocephalic, atraumatic.   Eyes:  Sclera clear, no icterus.   Conjunctiva pink.   Mouth:  No deformity or lesions, oropharynx pink & moist.  Neck:  Supple, trachea midline  Respiratory: Normal respiratory effort  Gastrointestinal:  Soft, non-tender and non-distended without masses, hepatosplenomegaly or hernias noted.  No guarding or rebound tenderness.     Cardiac: No clubbing or edema.  No cyanosis. Normal posterior tibial pedal pulses noted.  Lymphatic:  No significant cervical adenopathy.  Psych:  Alert and cooperative. Normal mood and affect.  Musculoskeletal:   Symmetrical without gross deformities. 5/5 Lower extremity strength bilaterally.  Skin: Warm. Intact without significant lesions or rashes. No jaundice.  Neurologic:  Face symmetrical, tongue midline, Normal sensation to touch;  grossly normal neurologically.  Psych:  Alert and oriented x3, Alert and cooperative. Normal mood and affect.  Impression/Plan: Christy Fernandez is here for a colonoscopy to be performed for average risk screening. Referral was placed for family history of colon cancer. However, pt denies any first degree relatives with colon cancer. Her grandmother is the only family member who had colon cancer in her family, and therefore pt does not qualify for increased risk screening at this time based on this history.   Risks, benefits, limitations, and alternatives regarding  colonoscopy have been reviewed with the patient.  Questions have been answered.  All parties agreeable.   Virgel Manifold, MD  12/27/2020, 9:02 AM

## 2020-12-27 NOTE — Op Note (Signed)
[facility] Patient Name: Christy Fernandez Procedure Date: 12/27/2020 9:05 AM MRN: [id number] Account #: [id number] Date of Birth: [birth year] Admit Type: Outpatient Age: 50 Room: [facility] ENDO ROOM 2 Gender: Female Note Status: Finalized Instrument Name: Jasper Riling [id number] Procedure:             Colonoscopy Indications:           Screening for colorectal malignant neoplasm Providers:             Soraiya Ahner B. Bonna Gains MD, MD Referring MD:          Deborra Medina, MD (Referring MD) Medicines:             Monitored Anesthesia Care Complications:         No immediate complications. Procedure:             Pre-Anesthesia Assessment:                        - ASA Grade Assessment: II - A patient with mild                         systemic disease.                        - Prior to the procedure, a History and Physical was                         performed, and patient medications, allergies and                         sensitivities were reviewed. The patient's tolerance                         of previous anesthesia was reviewed.                        - The risks and benefits of the procedure and the                         sedation options and risks were discussed with the                         patient. All questions were answered and informed                         consent was obtained.                        - Patient identification and proposed procedure were                         verified prior to the procedure by the physician, the                         nurse, the anesthesiologist, the anesthetist and the                         technician. The procedure was verified in the  procedure room.                        After obtaining informed consent, the colonoscope was                         passed under direct vision. Throughout the procedure,                         the patient's blood pressure, pulse, and oxygen                          saturations were monitored continuously. The                         Colonoscope was introduced through the anus and                         advanced to the the cecum, identified by appendiceal                         orifice and ileocecal valve. The colonoscopy was                         performed with ease. The patient tolerated the                         procedure well. The quality of the bowel preparation                         was good except the sigmoid colon was fair. Findings:      The perianal and digital rectal examinations were normal.      A 5 mm polyp was found in the sigmoid colon. The polyp was flat. The       polyp was removed with a cold snare. Resection and retrieval were       complete.      A 10 mm polyp was found in the sigmoid colon. The polyp was flat. The       polyp was removed with a piecemeal technique using a cold snare.       Resection and retrieval were complete.      A 8 mm polyp was found in the hepatic flexure. The polyp was flat.       Polypectomy was attempted, initially using a cold snare. Polyp resection       was incomplete with this device. This intervention then required a       different device and polypectomy technique. The polyp was removed with a       jumbo cold forceps. Resection and retrieval were complete. Due to its       position and since it was flat, The polyp could not be captured in a       cold snare and was not removed with a cold snare at all. It was       completely removed via jumbo forceps.      A 12 mm polyp was found in the descending colon. The polyp was flat. The       polyp was removed with a piecemeal technique using a cold snare.       Resection and  retrieval were complete.      A 5 mm polyp was found in the descending colon. The polyp was flat. The       polyp was removed with a cold snare. Resection and retrieval were       complete.      The exam was otherwise without abnormality.      The rectum, sigmoid  colon, descending colon, transverse colon, ascending       colon and cecum appeared normal.      The retroflexed view of the distal rectum and anal verge was normal and       showed no anal or rectal abnormalities. Impression:            - One 5 mm polyp in the sigmoid colon, removed with a                         cold snare. Resected and retrieved.                        - One 10 mm polyp in the sigmoid colon, removed                         piecemeal using a cold snare. Resected and retrieved.                        - One 8 mm polyp at the hepatic flexure, removed with                         a jumbo cold forceps. Resected and retrieved.                        - One 12 mm polyp in the descending colon, removed                         piecemeal using a cold snare. Resected and retrieved.                        - One 5 mm polyp in the descending colon, removed with                         a cold snare. Resected and retrieved.                        - The examination was otherwise normal.                        - The rectum, sigmoid colon, descending colon,                         transverse colon, ascending colon and cecum are normal.                        - The distal rectum and anal verge are normal on                         retroflexion view. Recommendation:        - Discharge patient to home (with escort).                        -  Advance diet as tolerated.                        - Continue present medications.                        - Await pathology results.                        - Repeat colonoscopy date to be determined after                         pending pathology results are reviewed.                        - The findings and recommendations were discussed with                         the patient.                        - The findings and recommendations were discussed with                         the patient's family.                        - Return to primary care  physician as previously                         scheduled. Procedure Code(s):     --- Professional ---                        [phone], Colonoscopy, flexible; with removal of                         tumor(s), polyp(s), or other lesion(s) by snare                         technique                        45380, 35, Colonoscopy, flexible; with biopsy, single                         or multiple Diagnosis Code(s):     --- Professional ---                        Z12.11, Encounter for screening for malignant neoplasm                         of colon                        K63.5, Polyp of colon CPT copyright 2019 [employer]. All rights reserved. The codes documented in this report are preliminary and upon coder review may  be revised to meet current compliance requirements.  [name], MD Margretta Sidle B. Bonna Gains MD, MD 12/27/2020 10:13:15 AM This report has been signed electronically. Number of Addenda: 0 Note Initiated On: 12/27/2020 9:05 AM Scope Withdrawal Time: 0 hours 40 minutes 25 seconds  Total Procedure Duration: 0 hours 54 minutes 38 seconds  Estimated Blood Loss:  Estimated blood loss: none.      [facility]

## 2020-12-28 NOTE — Progress Notes (Signed)
Voicemail.  No message Left.

## 2020-12-30 ENCOUNTER — Encounter: Payer: Self-pay | Admitting: Gastroenterology

## 2020-12-30 LAB — SURGICAL PATHOLOGY

## 2020-12-31 ENCOUNTER — Encounter: Payer: Self-pay | Admitting: Gastroenterology

## 2021-01-10 DIAGNOSIS — D2261 Melanocytic nevi of right upper limb, including shoulder: Secondary | ICD-10-CM | POA: Diagnosis not present

## 2021-01-10 DIAGNOSIS — D225 Melanocytic nevi of trunk: Secondary | ICD-10-CM | POA: Diagnosis not present

## 2021-01-10 DIAGNOSIS — D2272 Melanocytic nevi of left lower limb, including hip: Secondary | ICD-10-CM | POA: Diagnosis not present

## 2021-01-10 DIAGNOSIS — D2262 Melanocytic nevi of left upper limb, including shoulder: Secondary | ICD-10-CM | POA: Diagnosis not present

## 2021-02-03 DIAGNOSIS — Z23 Encounter for immunization: Secondary | ICD-10-CM | POA: Diagnosis not present

## 2021-03-13 ENCOUNTER — Other Ambulatory Visit: Payer: Self-pay | Admitting: Internal Medicine

## 2021-09-29 ENCOUNTER — Other Ambulatory Visit: Payer: Self-pay | Admitting: Internal Medicine

## 2021-09-29 DIAGNOSIS — Z1231 Encounter for screening mammogram for malignant neoplasm of breast: Secondary | ICD-10-CM

## 2021-11-04 DIAGNOSIS — R252 Cramp and spasm: Secondary | ICD-10-CM | POA: Diagnosis not present

## 2021-11-04 DIAGNOSIS — Z0189 Encounter for other specified special examinations: Secondary | ICD-10-CM | POA: Diagnosis not present

## 2021-11-04 LAB — IRON,TIBC AND FERRITIN PANEL
Ferritin: 26
Iron: 74

## 2021-11-04 LAB — COMPREHENSIVE METABOLIC PANEL
Albumin: 4.4 (ref 3.5–5.0)
Calcium: 9.1 (ref 8.7–10.7)
Globulin: 2.3
eGFR: 106

## 2021-11-04 LAB — HEPATIC FUNCTION PANEL
ALT: 15 U/L (ref 7–35)
AST: 17 (ref 13–35)
Alkaline Phosphatase: 45 (ref 25–125)
Bilirubin, Total: 0.6

## 2021-11-04 LAB — CBC AND DIFFERENTIAL
HCT: 38 (ref 36–46)
Hemoglobin: 12.3 (ref 12.0–16.0)
Neutrophils Absolute: 2.8
Platelets: 364 10*3/uL (ref 150–400)
WBC: 4.7

## 2021-11-04 LAB — BASIC METABOLIC PANEL
BUN: 9 (ref 4–21)
Chloride: 102 (ref 99–108)
Creatinine: 0.7 (ref 0.5–1.1)
Glucose: 97
Potassium: 4.1 mEq/L (ref 3.5–5.1)
Sodium: 137 (ref 137–147)

## 2021-11-04 LAB — LIPID PANEL
Cholesterol: 180 (ref 0–200)
HDL: 52 (ref 35–70)
LDL Cholesterol: 103
Triglycerides: 141 (ref 40–160)

## 2021-11-04 LAB — TSH: TSH: 2.55 (ref 0.41–5.90)

## 2021-11-04 LAB — CBC: RBC: 4.23 (ref 3.87–5.11)

## 2021-11-04 LAB — VITAMIN D 25 HYDROXY (VIT D DEFICIENCY, FRACTURES): Vit D, 25-Hydroxy: 25.3

## 2021-11-04 LAB — HEMOGLOBIN A1C: Hemoglobin A1C: 5

## 2021-11-07 ENCOUNTER — Telehealth: Payer: Self-pay | Admitting: Internal Medicine

## 2021-11-07 ENCOUNTER — Ambulatory Visit
Admission: RE | Admit: 2021-11-07 | Discharge: 2021-11-07 | Disposition: A | Discharge status: Home / Self Care | Payer: BC Managed Care – PPO | Source: Ambulatory Visit | Attending: Internal Medicine | Admitting: Internal Medicine

## 2021-11-07 DIAGNOSIS — Z1231 Encounter for screening mammogram for malignant neoplasm of breast: Secondary | ICD-10-CM | POA: Diagnosis not present

## 2021-11-07 DIAGNOSIS — E559 Vitamin D deficiency, unspecified: Secondary | ICD-10-CM

## 2021-11-07 MED ORDER — VITAMIN D (ERGOCALCIFEROL) 1.25 MG (50000 UNIT) PO CAPS: ORAL_CAPSULE | ORAL | 0 refills | Status: DC

## 2021-11-07 NOTE — Assessment & Plan Note (Signed)
Chronic and recurrent.

## 2021-11-11 DIAGNOSIS — Z713 Dietary counseling and surveillance: Secondary | ICD-10-CM | POA: Diagnosis not present

## 2021-11-11 DIAGNOSIS — Z043 Encounter for examination and observation following other accident: Secondary | ICD-10-CM | POA: Diagnosis not present

## 2021-11-18 ENCOUNTER — Other Ambulatory Visit: Payer: Self-pay | Admitting: Internal Medicine

## 2021-11-20 ENCOUNTER — Encounter: Payer: Self-pay | Admitting: Internal Medicine

## 2021-11-20 ENCOUNTER — Other Ambulatory Visit: Payer: Self-pay

## 2021-11-20 ENCOUNTER — Telehealth: Payer: Self-pay

## 2021-11-20 ENCOUNTER — Ambulatory Visit (INDEPENDENT_AMBULATORY_CARE_PROVIDER_SITE_OTHER): Payer: BC Managed Care – PPO | Admitting: Internal Medicine

## 2021-11-20 VITALS — BP 112/76 | HR 82 | Temp 98.4°F | Ht 68.0 in | Wt 193.2 lb

## 2021-11-20 DIAGNOSIS — F410 Panic disorder [episodic paroxysmal anxiety] without agoraphobia: Secondary | ICD-10-CM

## 2021-11-20 DIAGNOSIS — N951 Menopausal and female climacteric states: Secondary | ICD-10-CM | POA: Diagnosis not present

## 2021-11-20 DIAGNOSIS — Z0000 Encounter for general adult medical examination without abnormal findings: Secondary | ICD-10-CM | POA: Diagnosis not present

## 2021-11-20 DIAGNOSIS — Z1211 Encounter for screening for malignant neoplasm of colon: Secondary | ICD-10-CM

## 2021-11-20 MED ORDER — PROPRANOLOL HCL ER 60 MG PO CP24
60.0000 mg | ORAL_CAPSULE | Freq: Every day | ORAL | 3 refills | Status: DC
Start: 2021-11-20 — End: 2022-02-20

## 2021-11-20 MED ORDER — SUMATRIPTAN SUCCINATE 100 MG PO TABS: ORAL_TABLET | ORAL | 5 refills | Status: DC

## 2021-11-20 MED ORDER — TIZANIDINE HCL 4 MG PO TABS: 4.0000 mg | ORAL_TABLET | Freq: Four times a day (QID) | ORAL | 0 refills | Status: AC | PRN

## 2021-11-20 MED ORDER — MELOXICAM 15 MG PO TABS: 15.0000 mg | ORAL_TABLET | Freq: Every day | ORAL | 1 refills | Status: DC

## 2021-11-20 MED ORDER — NA SULFATE-K SULFATE-MG SULF 17.5-3.13-1.6 GM/177ML PO SOLN: 708.0000 mL | Freq: Once | ORAL | 0 refills | Status: AC

## 2021-11-20 NOTE — Telephone Encounter (Signed)
Patient did not want to do the procedure till December and scheduled for 01/23/2022. She states she does not know if she can do a 2 day prep because she has migraines. She states she will try to do it

## 2021-11-20 NOTE — Patient Instructions (Addendum)
The book I referred to today when we were discussing your GI issues is Dr Claris Pong Bennett's book "AIP Diet for Beginners: A Comprehensive Guide to the Autoimmune Paleo protocol"     Check with Tammy about your tetanus vaccination   our records say 2011 was your last one

## 2021-11-20 NOTE — Progress Notes (Signed)
The patient is here for annual preventive examination and management of other chronic and acute problems.   The risk factors are reflected in the social history.   The roster of all physicians providing medical care to patient - is listed in the Snapshot section of the chart.   Activities of daily living:  The patient is 100% independent in all ADLs: dressing, toileting, feeding as well as independent mobility   Home safety : The patient has smoke detectors in the home. They wear seatbelts.  There are no unsecured firearms at home. There is no violence in the home.    There is no risks for hepatitis, STDs or HIV. There is no   history of blood transfusion. They have no travel history to infectious disease endemic areas of the world.   The patient has seen their dentist in the last six month. They have seen their eye doctor in the last year. The patinet  denies slight hearing difficulty with regard to whispered voices and some television programs.  They have deferred audiologic testing in the last year.  They do not  have excessive sun exposure. Discussed the need for sun protection: hats, long sleeves and use of sunscreen if there is significant sun exposure.    Diet: the importance of a healthy diet is discussed. They do have a healthy diet.   The benefits of regular aerobic exercise were discussed. The patient  exercises  3 to 5 days per week  for  60 minutes.    Depression screen: there are no signs or vegative symptoms of depression- irritability, change in appetite, anhedonia, sadness/tearfullness.   The following portions of the patient's history were reviewed and updated as appropriate: allergies, current medications, past family history, past medical history,  past surgical history, past social history  and problem list.   Visual acuity was not assessed per patient preference since the patient has regular follow up with an  ophthalmologist. Hearing and body mass index were assessed and  reviewed.    During the course of the visit the patient was educated and counseled about appropriate screening and preventive services including : fall prevention , diabetes screening, nutrition counseling, colorectal cancer screening, and recommended immunizations.    Chief Complaint:  Periods are occurring regularly and lasting 5 days,  but the first 3 days are VERY HEAVY and accompanied by moderate to severe cramping .  She has developed some heat intolerance/hot flashes.       Review of Symptoms  Patient denies headache, fevers, malaise, unintentional weight loss, skin rash, eye pain, sinus congestion and sinus pain, sore throat, dysphagia,  hemoptysis , cough, dyspnea, wheezing, chest pain, palpitations, orthopnea, edema, abdominal pain, nausea, melena, diarrhea, constipation, flank pain, dysuria, hematuria, urinary  Frequency, nocturia, numbness, tingling, seizures,  Focal weakness, Loss of consciousness,  Tremor, insomnia, depression, anxiety, and suicidal ideation.    Physical Exam:  BP 112/76 (BP Location: Left Arm, Patient Position: Sitting, Cuff Size: Normal)   Pulse 82   Temp 98.4 F (36.9 C) (Oral)   Ht '5\' 8"'$  (1.727 m)   Wt 193 lb 3.2 oz (87.6 kg)   SpO2 98%   BMI 29.38 kg/m    General appearance: alert, cooperative and appears stated age Ears: normal TM's and external ear canals both ears Throat: lips, mucosa, and tongue normal; teeth and gums normal Neck: no adenopathy, no carotid bruit, supple, symmetrical, trachea midline and thyroid not enlarged, symmetric, no tenderness/mass/nodules Back: symmetric, no curvature. ROM normal. No  CVA tenderness. Lungs: clear to auscultation bilaterally Heart: regular rate and rhythm, S1, S2 normal, no murmur, click, rub or gallop Abdomen: soft, non-tender; bowel sounds normal; no masses,  no organomegaly Pulses: 2+ and symmetric Skin: Skin color, texture, turgor normal. No rashes or lesions Lymph nodes: Cervical, supraclavicular,  and axillary nodes normal.   Assessment and Plan:  Routine general medical examination at a health care facility age appropriate education and counseling updated, referrals for preventative services and immunizations addressed, dietary and smoking counseling addressed, most recent labs reviewed.  I have personally reviewed and have noted:   1) the patient's medical and social history 2) The pt's use of alcohol, tobacco, and illicit drugs 3) The patient's current medications and supplements 4) Functional ability including ADL's, fall risk, home safety risk, hearing and visual impairment 5) Diet and physical activities 6) Evidence for depression or mood disorder 7) The patient's height, weight, and BMI have been recorded in the chart  I have made referrals, and provided counseling and education based on review of the above  Perimenopause With heavy menses and cramping.  Suspect fibroids..  She is not anemic .  Ultrasound of pelvis offered but deferred.  Discussed the role of OCP vs HRT in managing symptoms.   Panic disorder Inderal working very well for her for management of stage fright during public speaking    Updated Medication List Outpatient Encounter Medications as of 11/20/2021  Medication Sig   levothyroxine (SYNTHROID) 75 MCG tablet TAKE 1 TABLET BY MOUTH EVERY DAY   Multiple Vitamin (MULTIVITAMIN) tablet Take 1 tablet by mouth daily.   spironolactone (ALDACTONE) 50 MG tablet Take 1 tablet (50 mg total) by mouth daily. As needed for fluid retention   Vitamin D, Ergocalciferol, (DRISDOL) 1.25 MG (50000 UNIT) CAPS capsule TAKE 1 CAPSULE BY MOUTH ONE TIME PER WEEK   [DISCONTINUED] propranolol ER (INDERAL LA) 60 MG 24 hr capsule Take 1 capsule (60 mg total) by mouth daily.   [DISCONTINUED] SUMAtriptan (IMITREX) 100 MG tablet TAKE 1 TAB BY MOUTH ONCE FOR 1 DOSE MAY REPEAT IN 2 HRS IF HEADACHE PERSIST OR RECURS MAX DOSE 2 TAB   [DISCONTINUED] tiZANidine (ZANAFLEX) 4 MG tablet Take 1  tablet (4 mg total) by mouth every 6 (six) hours as needed for muscle spasms.   diazepam (VALIUM) 10 MG tablet Take 1 tablet (10 mg total) by mouth every 12 (twelve) hours as needed for anxiety. (Patient not taking: Reported on 12/27/2020)   Magnesium 250 MG TABS Take 1 tablet by mouth daily. (Patient not taking: Reported on 11/20/2021)   meloxicam (MOBIC) 15 MG tablet Take 1 tablet (15 mg total) by mouth daily.   propranolol ER (INDERAL LA) 60 MG 24 hr capsule Take 1 capsule (60 mg total) by mouth daily.   SUMAtriptan (IMITREX) 100 MG tablet TAKE 1 TAB BY MOUTH ONCE FOR 1 DOSE MAY REPEAT IN 2 HRS IF HEADACHE PERSIST OR RECURS MAX DOSE 2 TAB   tiZANidine (ZANAFLEX) 4 MG tablet Take 1 tablet (4 mg total) by mouth every 6 (six) hours as needed for muscle spasms.   [DISCONTINUED] Butalbital-APAP-Caffeine 50-300-40 MG CAPS TAKE 1-2 CAPSULES BY MOUTH EVERY 6 8 HOURS AS NEEDED FOR HEADACHE (Patient not taking: Reported on 11/20/2021)   [DISCONTINUED] Coenzyme Q10 (COQ10) 200 MG CAPS Take 1 capsule by mouth daily. (Patient not taking: Reported on 11/20/2021)   [DISCONTINUED] eletriptan (RELPAX) 20 MG tablet Take 1 tablet (20 mg total) by mouth as needed for migraine or headache.  May repeat in 2 hours if headache persists or recurs. (Patient not taking: Reported on 12/27/2020)   [DISCONTINUED] meloxicam (MOBIC) 15 MG tablet Take 15 mg by mouth daily. (Patient not taking: Reported on 12/27/2020)   [DISCONTINUED] mometasone (NASONEX) 50 MCG/ACT nasal spray Place 2 sprays into the nose daily. (Patient not taking: Reported on 11/20/2021)   [DISCONTINUED] Omega 3 1200 MG CAPS Take 1 capsule by mouth daily. (Patient not taking: Reported on 11/20/2021)   [DISCONTINUED] triamcinolone ointment (KENALOG) 0.1 % SMARTSIG:1 Topical Daily PRN (Patient not taking: Reported on 11/20/2021)   No facility-administered encounter medications on file as of 11/20/2021.

## 2021-11-20 NOTE — Assessment & Plan Note (Signed)
With heavy menses and cramping.  Suspect fibroids..  She is not anemic .  Ultrasound of pelvis offered but deferred.  Discussed the role of OCP vs HRT in managing symptoms.

## 2021-11-20 NOTE — Telephone Encounter (Signed)
Called and left a message for call back  

## 2021-11-20 NOTE — Telephone Encounter (Signed)
Per Dr. Derrel Nip:  Good morning.  I'm seeing patient for CPE ,  she has not been called by your office for the one year follow up colonoscopy,  due in November due to 4 serrated polyps  and FH of colon CA.  can your office handle? Thanks  Per Dr. Marius Ditch: Christy Fernandez, please schedule colonoscopy, with 2 day prep, thanks

## 2021-11-20 NOTE — Assessment & Plan Note (Signed)

## 2021-11-20 NOTE — Assessment & Plan Note (Signed)
Inderal working very well for her for management of stage fright during public speaking

## 2021-12-23 ENCOUNTER — Telehealth: Payer: Self-pay

## 2021-12-23 NOTE — Telephone Encounter (Signed)
Patient called and left a voicemail and she needs to reschedule her colonoscopy for 01/23/2022. Called patient back and she states her daughter has appointment that day. Patient asked if she could move it to 02/27/2022. Informed her yes. Mailed new paperwork. Called ENDO and talk to [place] and got patient moved

## 2022-01-21 DIAGNOSIS — D2272 Melanocytic nevi of left lower limb, including hip: Secondary | ICD-10-CM | POA: Diagnosis not present

## 2022-01-21 DIAGNOSIS — D2261 Melanocytic nevi of right upper limb, including shoulder: Secondary | ICD-10-CM | POA: Diagnosis not present

## 2022-01-21 DIAGNOSIS — D225 Melanocytic nevi of trunk: Secondary | ICD-10-CM | POA: Diagnosis not present

## 2022-01-21 DIAGNOSIS — D2262 Melanocytic nevi of left upper limb, including shoulder: Secondary | ICD-10-CM | POA: Diagnosis not present

## 2022-02-04 NOTE — Telephone Encounter (Signed)
MyChart messgae sent to patient. 

## 2022-02-20 ENCOUNTER — Other Ambulatory Visit: Payer: Self-pay | Admitting: Internal Medicine

## 2022-02-27 ENCOUNTER — Ambulatory Visit: Payer: BC Managed Care – PPO | Admitting: Anesthesiology

## 2022-02-27 ENCOUNTER — Encounter
Admission: RE | Disposition: A | Discharge status: Home / Self Care | Payer: Self-pay | Source: Ambulatory Visit | Attending: Gastroenterology

## 2022-02-27 ENCOUNTER — Ambulatory Visit
Admission: RE | Admit: 2022-02-27 | Discharge: 2022-02-27 | Disposition: A | Discharge status: Home / Self Care | Payer: BC Managed Care – PPO | Source: Ambulatory Visit | Attending: Gastroenterology | Admitting: Gastroenterology

## 2022-02-27 DIAGNOSIS — Z860100 Personal history of colon polyps, unspecified: Secondary | ICD-10-CM

## 2022-02-27 DIAGNOSIS — E039 Hypothyroidism, unspecified: Secondary | ICD-10-CM | POA: Diagnosis not present

## 2022-02-27 DIAGNOSIS — Z860101 Personal history of adenomatous and serrated colon polyps: Secondary | ICD-10-CM

## 2022-02-27 DIAGNOSIS — Z1211 Encounter for screening for malignant neoplasm of colon: Secondary | ICD-10-CM

## 2022-02-27 DIAGNOSIS — D509 Iron deficiency anemia, unspecified: Secondary | ICD-10-CM | POA: Diagnosis not present

## 2022-02-27 DIAGNOSIS — G43909 Migraine, unspecified, not intractable, without status migrainosus: Secondary | ICD-10-CM | POA: Diagnosis not present

## 2022-02-27 DIAGNOSIS — K635 Polyp of colon: Secondary | ICD-10-CM | POA: Diagnosis not present

## 2022-02-27 DIAGNOSIS — Z09 Encounter for follow-up examination after completed treatment for conditions other than malignant neoplasm: Secondary | ICD-10-CM | POA: Insufficient documentation

## 2022-02-27 HISTORY — PX: COLONOSCOPY WITH PROPOFOL: SHX5780

## 2022-02-27 SURGERY — COLONOSCOPY WITH PROPOFOL
Anesthesia: General

## 2022-02-27 MED ORDER — PROPOFOL 500 MG/50ML IV EMUL
INTRAVENOUS | Status: DC | PRN
  Administered 2022-02-27: 125 ug/kg/min via INTRAVENOUS

## 2022-02-27 MED ORDER — PROPOFOL 10 MG/ML IV BOLUS
INTRAVENOUS | Status: DC | PRN
  Administered 2022-02-27: 80 mg via INTRAVENOUS
  Administered 2022-02-27: 30 mg via INTRAVENOUS
  Administered 2022-02-27: 20 mg via INTRAVENOUS
  Administered 2022-02-27: 50 mg via INTRAVENOUS

## 2022-02-27 MED ORDER — STERILE WATER FOR IRRIGATION IR SOLN
Status: DC | PRN
  Administered 2022-02-27: 60 mL

## 2022-02-27 MED ORDER — SODIUM CHLORIDE 0.9 % IV SOLN
INTRAVENOUS | Status: DC
  Administered 2022-02-27: 20 mL/h via INTRAVENOUS

## 2022-02-27 MED ORDER — DEXMEDETOMIDINE HCL IN NACL 80 MCG/20ML IV SOLN
INTRAVENOUS | Status: DC | PRN
  Administered 2022-02-27: 10 ug via BUCCAL

## 2022-02-27 MED ORDER — LIDOCAINE HCL (CARDIAC) PF 100 MG/5ML IV SOSY
PREFILLED_SYRINGE | INTRAVENOUS | Status: DC | PRN
  Administered 2022-02-27: 10 mg via INTRAVENOUS

## 2022-02-27 MED ORDER — PROPOFOL 10 MG/ML IV BOLUS
INTRAVENOUS | Status: AC
  Filled 2022-02-27: qty 20

## 2022-02-27 NOTE — Op Note (Signed)
[facility] Patient Name: Christy Fernandez Procedure Date: 02/27/2022 8:33 AM MRN: [id number] Account #: [id number] Date of Birth: October 13, 1970 Admit Type: Outpatient Age: 52 Room: [facility] ENDO ROOM 2 Gender: Female Note Status: Finalized Instrument Name: Park Meo [id number] Procedure:             Colonoscopy Indications:           Surveillance: Personal history of adenomatous polyps                         on last colonoscopy 3 years ago, Surveillance: History                         of adenomatous polyps, inadequate prep on last exam                         (<1yr, Last colonoscopy: November 2022 Providers:             RLin LandsmanMD, MD Referring MD:          TDeborra Medina MD (Referring MD) Medicines:             General Anesthesia Complications:         No immediate complications. Estimated blood loss: None. Procedure:             Pre-Anesthesia Assessment:                        - Prior to the procedure, a History and Physical was                         performed, and patient medications and allergies were                         reviewed. The patient is competent. The risks and                         benefits of the procedure and the sedation options and                         risks were discussed with the patient. All questions                         were answered and informed consent was obtained.                         Patient identification and proposed procedure were                         verified by the physician, the nurse, the                         anesthesiologist, the anesthetist and the technician                         in the pre-procedure area in the procedure room in the                         endoscopy suite. Mental Status Examination: alert and  oriented. Airway Examination: normal oropharyngeal                         airway and neck mobility. Respiratory Examination:                          clear to auscultation. CV Examination: normal.                         Prophylactic Antibiotics: The patient does not require                         prophylactic antibiotics. Prior Anticoagulants: The                         patient has taken no anticoagulant or antiplatelet                         agents. ASA Grade Assessment: II - A patient with mild                         systemic disease. After reviewing the risks and                         benefits, the patient was deemed in satisfactory                         condition to undergo the procedure. The anesthesia                         plan was to use general anesthesia. Immediately prior                         to administration of medications, the patient was                         re-assessed for adequacy to receive sedatives. The                         heart rate, respiratory rate, oxygen saturations,                         blood pressure, adequacy of pulmonary ventilation, and                         response to care were monitored throughout the                         procedure. The physical status of the patient was                         re-assessed after the procedure.                        After obtaining informed consent, the colonoscope was                         passed under direct vision. Throughout the procedure,  the patient's blood pressure, pulse, and oxygen                         saturations were monitored continuously. The                         Colonoscope was introduced through the anus and                         advanced to the the cecum, identified by appendiceal                         orifice and ileocecal valve. The colonoscopy was                         performed without difficulty. The patient tolerated                         the procedure well. The quality of the bowel                         preparation was evaluated using the BBPS [facility] Bowel                          Preparation Scale) with scores of: Right Colon = 3,                         Transverse Colon = 3 and Left Colon = 3 (entire mucosa                         seen well with no residual staining, small fragments                         of stool or opaque liquid). The total BBPS score                         equals 9. The ileocecal valve, appendiceal orifice,                         and rectum were photographed. Findings:      The perianal and digital rectal examinations were normal. Pertinent       negatives include normal sphincter tone and no palpable rectal lesions.      The entire examined colon appeared normal.      The retroflexed view of the distal rectum and anal verge was normal and       showed no anal or rectal abnormalities. Impression:            - The entire examined colon is normal.                        - The distal rectum and anal verge are normal on                         retroflexion view.                        - No specimens collected. Recommendation:        -  Discharge patient to home (with escort).                        - Resume previous diet today.                        - Continue present medications.                        - Repeat colonoscopy in 5 years for surveillance. Procedure Code(s):     --- Professional ---                        H8850, Colorectal cancer screening; colonoscopy on                         individual at high risk Diagnosis Code(s):     --- Professional ---                        Z86.010, Personal history of colonic polyps CPT copyright 2022 American Medical Association. All rights reserved. The codes documented in this report are preliminary and upon coder review may  be revised to meet current compliance requirements. Dr. Ulyess Mort Lin Landsman MD, MD 02/27/2022 9:03:20 AM This report has been signed electronically. Number of Addenda: 0 Note Initiated On: 02/27/2022 8:33 AM Scope Withdrawal Time: 0 hours 11 minutes 13 seconds   Total Procedure Duration: 0 hours 18 minutes 49 seconds  Estimated Blood Loss:  Estimated blood loss: none.      [facility]

## 2022-02-27 NOTE — Anesthesia Postprocedure Evaluation (Signed)
Anesthesia Post Note  Patient: KASHARI CHALMERS  Procedure(s) Performed: COLONOSCOPY WITH PROPOFOL  Patient location during evaluation: Endoscopy Anesthesia Type: General Level of consciousness: awake and alert Pain management: pain level controlled Vital Signs Assessment: post-procedure vital signs reviewed and stable Respiratory status: spontaneous breathing, nonlabored ventilation, respiratory function stable and patient connected to nasal cannula oxygen Cardiovascular status: blood pressure returned to baseline and stable Postop Assessment: no apparent nausea or vomiting Anesthetic complications: no   No notable events documented.   Last Vitals:  Vitals:   02/27/22 0731 02/27/22 0906  BP: (!) 145/80   Pulse: 64   Resp: 20   Temp: (!) 35.8 C (!) 35.9 C  SpO2: 100%     Last Pain:  Vitals:   02/27/22 0916  TempSrc:   PainSc: 0-No pain                 Precious Haws Twylah Bennetts

## 2022-02-27 NOTE — Anesthesia Preprocedure Evaluation (Signed)
Anesthesia Evaluation  Patient identified by MRN, date of birth, ID band Patient awake    Reviewed: Allergy & Precautions, NPO status , Patient's Chart, lab work & pertinent test results  History of Anesthesia Complications Negative for: history of anesthetic complications  Airway Mallampati: III  TM Distance: <3 FB Neck ROM: full    Dental  (+) Chipped   Pulmonary neg pulmonary ROS, neg shortness of breath   Pulmonary exam normal        Cardiovascular Exercise Tolerance: Good (-) angina negative cardio ROS Normal cardiovascular exam     Neuro/Psych  Headaches  negative psych ROS   GI/Hepatic negative GI ROS, Neg liver ROS,neg GERD  ,,  Endo/Other  Hypothyroidism    Renal/GU negative Renal ROS  negative genitourinary   Musculoskeletal   Abdominal   Peds  Hematology negative hematology ROS (+)   Anesthesia Other Findings Past Medical History: No date: Hypothyroidism No date: Migraine  Past Surgical History: 12/27/2020: COLONOSCOPY WITH PROPOFOL; N/A     Comment:  Procedure: COLONOSCOPY WITH PROPOFOL;  Surgeon:               Virgel Manifold, MD;  Location: [facility] ENDOSCOPY;                Service: Endoscopy;  Laterality: N/A; 2002: LEEP No date: septoplasty  BMI    Body Mass Index: 28.89 kg/m      Reproductive/Obstetrics negative OB ROS                             Anesthesia Physical Anesthesia Plan  ASA: 2  Anesthesia Plan: General   Post-op Pain Management:    Induction: Intravenous  PONV Risk Score and Plan: Propofol infusion and TIVA  Airway Management Planned: Natural Airway and Nasal Cannula  Additional Equipment:   Intra-op Plan:   Post-operative Plan:   Informed Consent: I have reviewed the patients History and Physical, chart, labs and discussed the procedure including the risks, benefits and alternatives for the proposed anesthesia with the patient or  authorized representative who has indicated his/her understanding and acceptance.     Dental Advisory Given  Plan Discussed with: Anesthesiologist, CRNA and Surgeon  Anesthesia Plan Comments: (Patient consented for risks of anesthesia including but not limited to:  - adverse reactions to medications - risk of airway placement if required - damage to eyes, teeth, lips or other oral mucosa - nerve damage due to positioning  - sore throat or hoarseness - Damage to heart, brain, nerves, lungs, other parts of body or loss of life  Patient voiced understanding.)       Anesthesia Quick Evaluation

## 2022-02-27 NOTE — Transfer of Care (Signed)
Immediate Anesthesia Transfer of Care Note  Patient: Christy Fernandez  Procedure(s) Performed: COLONOSCOPY WITH PROPOFOL  Patient Location: Endoscopy Unit  Anesthesia Type:General  Level of Consciousness: drowsy  Airway & Oxygen Therapy: Patient Spontanous Breathing  Post-op Assessment: Report given to RN  Post vital signs: stable  Last Vitals:  Vitals Value Taken Time  BP 105/66 02/27/22 0906  Temp    Pulse 71 02/27/22 0906  Resp 21 02/27/22 0906  SpO2 100 % 02/27/22 0906  Vitals shown include unvalidated device data.  Last Pain:  Vitals:   02/27/22 0731  TempSrc: Temporal  PainSc: 0-No pain         Complications: No notable events documented.

## 2022-02-27 NOTE — H&P (Signed)
Cephas Darby, MD [address]  [place]  [place], [place] [address]  Main: [phone]  Fax: [phone] Pager: [phone]  Primary Care Physician:  Crecencio Mc, MD Primary Gastroenterologist:  Dr. Cephas Darby  Pre-Procedure History & Physical: HPI:  Christy Fernandez is a 52 y.o. female is here for an colonoscopy.   Past Medical History:  Diagnosis Date   Hypothyroidism    Migraine     Past Surgical History:  Procedure Laterality Date   COLONOSCOPY WITH PROPOFOL N/A 12/27/2020   Procedure: COLONOSCOPY WITH PROPOFOL;  Surgeon: Virgel Manifold, MD;  Location: [facility] ENDOSCOPY;  Service: Endoscopy;  Laterality: N/A;   LEEP  2002   septoplasty      Prior to Admission medications   Medication Sig Start Date End Date Taking? Authorizing Provider  levothyroxine (SYNTHROID) 75 MCG tablet TAKE 1 TABLET BY MOUTH EVERY DAY 11/19/21  Yes Crecencio Mc, MD  meloxicam (MOBIC) 15 MG tablet Take 1 tablet (15 mg total) by mouth daily. 11/20/21  Yes Crecencio Mc, MD  Multiple Vitamin (MULTIVITAMIN) tablet Take 1 tablet by mouth daily.   Yes [provider]  propranolol ER (INDERAL LA) 60 MG 24 hr capsule TAKE 1 CAPSULE BY MOUTH EVERY DAY 02/20/22  Yes Crecencio Mc, MD  SUMAtriptan (IMITREX) 100 MG tablet TAKE 1 TAB BY MOUTH ONCE FOR 1 DOSE MAY REPEAT IN 2 HRS IF HEADACHE PERSIST OR RECURS MAX DOSE 2 TAB 11/20/21  Yes Crecencio Mc, MD  tiZANidine (ZANAFLEX) 4 MG tablet Take 1 tablet (4 mg total) by mouth every 6 (six) hours as needed for muscle spasms. 11/20/21  Yes Crecencio Mc, MD  Vitamin D, Ergocalciferol, (DRISDOL) 1.25 MG (50000 UNIT) CAPS capsule TAKE 1 CAPSULE BY MOUTH ONE TIME PER WEEK 11/07/21  Yes Crecencio Mc, MD  diazepam (VALIUM) 10 MG tablet Take 1 tablet (10 mg total) by mouth every 12 (twelve) hours as needed for anxiety. Patient not taking: Reported on 12/27/2020 07/20/17   Crecencio Mc, MD  Magnesium 250 MG TABS Take 1 tablet by mouth  daily. Patient not taking: Reported on 11/20/2021    [provider]  spironolactone (ALDACTONE) 50 MG tablet Take 1 tablet (50 mg total) by mouth daily. As needed for fluid retention 08/09/18 11/20/21  Crecencio Mc, MD    Allergies as of 11/20/2021 - Review Complete 11/20/2021  Allergen Reaction Noted   Latex  08/31/2011    Family History  Problem Relation Age of Onset   Cancer Maternal Grandmother        colon   Cancer Maternal Grandfather        colon   Cancer Paternal Grandmother        breast    Diabetes Paternal Grandfather     Social History   Socioeconomic History   Marital status: Married    Spouse name: Not on file   Number of children: Not on file   Years of education: Not on file   Highest education level: Not on file  Occupational History   Not on file  Tobacco Use   Smoking status: Never   Smokeless tobacco: Never  Vaping Use   Vaping Use: Never used  Substance and Sexual Activity   Alcohol use: Yes    Comment: occ   Drug use: No   Sexual activity: Not on file  Other Topics Concern   Not on file  Social History Narrative   Not  on file   Social Determinants of Health   Financial Resource Strain: Not on file  Food Insecurity: Not on file  Transportation Needs: Not on file  Physical Activity: Not on file  Stress: Not on file  Social Connections: Not on file  Intimate Partner Violence: Not on file    Review of Systems: See HPI, otherwise negative ROS  Physical Exam: BP (!) 145/80   Pulse 64   Temp (!) 96.5 F (35.8 C) (Temporal)   Resp 20   Ht '5\' 8"'$  (1.727 m)   Wt 86.2 kg   SpO2 100%   BMI 28.89 kg/m  General:   Alert,  pleasant and cooperative in NAD Head:  Normocephalic and atraumatic. Neck:  Supple; no masses or thyromegaly. Lungs:  Clear throughout to auscultation.    Heart:  Regular rate and rhythm. Abdomen:  Soft, nontender and nondistended. Normal bowel sounds, without guarding, and without rebound.   Neurologic:   Alert and  oriented x4;  grossly normal neurologically.  Impression/Plan: Christy Fernandez is here for an colonoscopy to be performed for h/o colon adenomas  Risks, benefits, limitations, and alternatives regarding  colonoscopy have been reviewed with the patient.  Questions have been answered.  All parties agreeable.   Sherri Sear, MD  02/27/2022, 8:07 AM

## 2022-02-28 NOTE — Progress Notes (Signed)
Voicemail.  No Message Left. 

## 2022-03-02 ENCOUNTER — Encounter: Payer: Self-pay | Admitting: Gastroenterology

## 2022-05-05 ENCOUNTER — Telehealth: Payer: Self-pay

## 2022-05-05 MED ORDER — SUMATRIPTAN SUCCINATE 100 MG PO TABS: ORAL_TABLET | ORAL | 5 refills | Status: DC

## 2022-05-06 ENCOUNTER — Other Ambulatory Visit: Payer: Self-pay

## 2022-05-06 MED ORDER — VITAMIN D (ERGOCALCIFEROL) 1.25 MG (50000 UNIT) PO CAPS: ORAL_CAPSULE | ORAL | 0 refills | Status: DC

## 2022-05-06 NOTE — Telephone Encounter (Signed)
Refilled: 11/07/2021 Last OV: 11/20/2021 Next OV: 11/25/2022

## 2022-05-12 MED ORDER — PROPRANOLOL HCL ER 60 MG PO CP24: 60.0000 mg | ORAL_CAPSULE | Freq: Every day | ORAL | 1 refills | Status: DC

## 2022-05-12 MED ORDER — LEVOTHYROXINE SODIUM 75 MCG PO TABS: 75.0000 ug | ORAL_TABLET | Freq: Every day | ORAL | 1 refills | Status: DC

## 2022-05-12 NOTE — Addendum Note (Signed)
Addended by: Adair Laundry on: 05/12/2022 03:55 PM   Modules accepted: Orders

## 2022-05-12 NOTE — Telephone Encounter (Signed)
Spoke with pt and she stated that she only needs her maintenance medication sent to [facility]. They have been sent.

## 2022-05-12 NOTE — Telephone Encounter (Signed)
[place] called from [facility] stating pt need a refill on Vitamin D and SUMAtriptan 90 day supply with three additional refills  [facility] [address] [place] Phone-[phone] [place] Fax [phone]

## 2022-08-01 ENCOUNTER — Other Ambulatory Visit: Payer: Self-pay | Admitting: Internal Medicine

## 2022-08-02 ENCOUNTER — Other Ambulatory Visit: Payer: Self-pay | Admitting: Internal Medicine

## 2022-10-06 ENCOUNTER — Other Ambulatory Visit: Payer: Self-pay | Admitting: Internal Medicine

## 2022-10-06 DIAGNOSIS — Z1231 Encounter for screening mammogram for malignant neoplasm of breast: Secondary | ICD-10-CM

## 2022-11-11 ENCOUNTER — Ambulatory Visit
Admission: RE | Admit: 2022-11-11 | Discharge: 2022-11-11 | Disposition: A | Discharge status: Home / Self Care | Payer: BC Managed Care – PPO | Source: Ambulatory Visit | Attending: Internal Medicine | Admitting: Internal Medicine

## 2022-11-11 DIAGNOSIS — Z1231 Encounter for screening mammogram for malignant neoplasm of breast: Secondary | ICD-10-CM | POA: Diagnosis not present

## 2022-11-13 LAB — LIPID PANEL
Cholesterol: 200 (ref 0–200)
HDL: 52 (ref 35–70)
LDL Cholesterol: 117
Triglycerides: 180 — AB (ref 40–160)

## 2022-11-13 LAB — BASIC METABOLIC PANEL
BUN: 10 (ref 4–21)
Chloride: 106 (ref 99–108)
Creatinine: 0.8 (ref 0.5–1.1)
Glucose: 96
Potassium: 4.6 meq/L (ref 3.5–5.1)
Sodium: 139 (ref 137–147)

## 2022-11-13 LAB — VITAMIN D 25 HYDROXY (VIT D DEFICIENCY, FRACTURES): Vit D, 25-Hydroxy: 33.5

## 2022-11-13 LAB — HEMOGLOBIN A1C: Hemoglobin A1C: 5.4

## 2022-11-13 LAB — VITAMIN B12: Vitamin B-12: 460

## 2022-11-13 LAB — IRON,TIBC AND FERRITIN PANEL: Ferritin: 111

## 2022-11-13 LAB — HEPATIC FUNCTION PANEL
ALT: 17 U/L (ref 7–35)
AST: 17 (ref 13–35)
Alkaline Phosphatase: 47 (ref 25–125)
Bilirubin, Total: 0.4

## 2022-11-13 LAB — COMPREHENSIVE METABOLIC PANEL
Calcium: 9.2 (ref 8.7–10.7)
Globulin: 2.5
eGFR: 95

## 2022-11-13 LAB — LAB REPORT - SCANNED
A1c: 5.4
EGFR: 95

## 2022-11-25 ENCOUNTER — Encounter: Payer: Self-pay | Admitting: Internal Medicine

## 2022-11-25 ENCOUNTER — Ambulatory Visit: Payer: BC Managed Care – PPO | Admitting: Internal Medicine

## 2022-11-25 VITALS — BP 118/78 | HR 66 | Temp 98.3°F | Ht 68.0 in | Wt 194.6 lb

## 2022-11-25 DIAGNOSIS — M51362 Other intervertebral disc degeneration, lumbar region with discogenic back pain and lower extremity pain: Secondary | ICD-10-CM | POA: Diagnosis not present

## 2022-11-25 DIAGNOSIS — N921 Excessive and frequent menstruation with irregular cycle: Secondary | ICD-10-CM

## 2022-11-25 DIAGNOSIS — F410 Panic disorder [episodic paroxysmal anxiety] without agoraphobia: Secondary | ICD-10-CM

## 2022-11-25 DIAGNOSIS — M25552 Pain in left hip: Secondary | ICD-10-CM

## 2022-11-25 DIAGNOSIS — Z0000 Encounter for general adult medical examination without abnormal findings: Secondary | ICD-10-CM | POA: Diagnosis not present

## 2022-11-25 DIAGNOSIS — D509 Iron deficiency anemia, unspecified: Secondary | ICD-10-CM | POA: Diagnosis not present

## 2022-11-25 DIAGNOSIS — M25551 Pain in right hip: Secondary | ICD-10-CM | POA: Diagnosis not present

## 2022-11-25 DIAGNOSIS — N951 Menopausal and female climacteric states: Secondary | ICD-10-CM

## 2022-11-25 DIAGNOSIS — Z0001 Encounter for general adult medical examination with abnormal findings: Secondary | ICD-10-CM

## 2022-11-25 MED ORDER — PANTOPRAZOLE SODIUM 40 MG PO TBEC: 40.0000 mg | DELAYED_RELEASE_TABLET | Freq: Every day | ORAL | 3 refills | Status: DC

## 2022-11-25 MED ORDER — PROPRANOLOL HCL ER 60 MG PO CP24: 60.0000 mg | ORAL_CAPSULE | Freq: Every day | ORAL | 1 refills | Status: AC

## 2022-11-25 MED ORDER — LEVOTHYROXINE SODIUM 75 MCG PO TABS: 75.0000 ug | ORAL_TABLET | Freq: Every day | ORAL | 1 refills | Status: DC

## 2022-11-25 MED ORDER — NORGESTREL-ETHINYL ESTRADIOL 0.3-30 MG-MCG PO TABS: 1.0000 | ORAL_TABLET | Freq: Every day | ORAL | 3 refills | Status: DC

## 2022-11-25 NOTE — Patient Instructions (Addendum)
Take the protonix in the morning on an empty stomach  Take the meloxicam once daily  with dinner or after  Physical therapy referral in progress  Ok to add tizanidine  You can add up to 2000 mg of acetominophen (tylenol) every day safely  In divided doses (500 mg every 6 hours  Or 1000 mg every 12 hours.)   Adding lo ovral to regulate your periods . We can increase the concentration of estrogen if your symptoms don't improve  You cholesterol is FINE>  you have no concerning labs.

## 2022-11-25 NOTE — Assessment & Plan Note (Addendum)
Plain films done last year were normal  but she has evidence of lumbar degenerative disk disease from plain films done 2 weeks prior . PT referral made

## 2022-11-25 NOTE — Assessment & Plan Note (Signed)
Inderal working very well for her for management of stage fright during public speaking

## 2022-11-25 NOTE — Assessment & Plan Note (Signed)

## 2022-11-25 NOTE — Assessment & Plan Note (Addendum)
Resolved by SEto [id number] [facility] labs

## 2022-11-25 NOTE — Assessment & Plan Note (Signed)
3 yr follow up screenig was done Jan 2024 and no polyps found.  Follow up in 2029

## 2022-11-25 NOTE — Assessment & Plan Note (Signed)
With heavy menses and cramping.  Suspect fibroids..  She is not anemic .  Ultrasound of pelvis offered but deferred.  Reviewed  the role of OCP vs HRT in managing symptoms. Resuming Lo ovral

## 2022-11-25 NOTE — Progress Notes (Signed)
Patient ID: Christy Fernandez, female    DOB: [birth year]  Age: 52 y.o. MRN: [id number]  The patient is here for annual preventive examination and management of other chronic and acute problems.   The risk factors are reflected in the social history.   The roster of all physicians providing medical care to patient - is listed in the Snapshot section of the chart.   Activities of daily living:  The patient is 100% independent in all ADLs: dressing, toileting, feeding as well as independent mobility   Home safety : The patient has smoke detectors in the home. They wear seatbelts.  There are no unsecured firearms at home. There is no violence in the home.    There is no risks for hepatitis, STDs or HIV. There is no   history of blood transfusion. They have no travel history to infectious disease endemic areas of the world.   The patient has seen their dentist in the last six month. They have seen their eye doctor in the last year. The patinet  denies slight hearing difficulty with regard to whispered voices and some television programs.  They have deferred audiologic testing in the last year.  They do not  have excessive sun exposure. Discussed the need for sun protection: hats, long sleeves and use of sunscreen if there is significant sun exposure.    Diet: the importance of a healthy diet is discussed. They do have a healthy diet.   The benefits of regular aerobic exercise were discussed. The patient  walks   3 to 5 days per week  for  60 minutes.    Depression screen: there are no signs or vegative symptoms of depression- irritability, change in appetite, anhedonia, sadness/tearfullness.   The following portions of the patient's history were reviewed and updated as appropriate: allergies, current medications, past family history, past medical history,  past surgical history, past social history  and problem list.   Visual acuity was not assessed per patient preference since the patient has regular  follow up with an  ophthalmologist. Hearing and body mass index were assessed and reviewed.    During the course of the visit the patient was educated and counseled about appropriate screening and preventive services including : fall prevention , diabetes screening, nutrition counseling, colorectal cancer screening, and recommended immunizations.    Chief Complaint:  1) persistent right hip pain , lateral  worse than left . Walking 1.5 miles daily,  stretching daily ,  PLAIN FILMS NORMAL SEPT 2022   2) perimenopause:    periods are "too heavy to leave the bathroom"   for 2 days,  periods last anywhere from 2 to 5 days  and irregularly occurring . Also reports having brain fog.   Review of Symptoms  Patient denies headache, fevers, malaise, unintentional weight loss, skin rash, eye pain, sinus congestion and sinus pain, sore throat, dysphagia,  hemoptysis , cough, dyspnea, wheezing, chest pain, palpitations, orthopnea, edema, abdominal pain, nausea, melena, diarrhea, constipation, flank pain, dysuria, hematuria, urinary  Frequency, nocturia, numbness, tingling, seizures,  Focal weakness, Loss of consciousness,  Tremor, insomnia, depression, anxiety, and suicidal ideation.    Physical Exam:  BP 118/78   Pulse 66   Temp 98.3 F (36.8 C) (Oral)   Ht 5\' 8"  (1.727 m)   Wt 194 lb 9.6 oz (88.3 kg)   SpO2 99%   BMI 29.59 kg/m    Physical Exam Vitals reviewed.  Constitutional:      General: She is  not in acute distress.    Appearance: Normal appearance. She is normal weight. She is not ill-appearing, toxic-appearing or diaphoretic.  HENT:     Head: Normocephalic.  Eyes:     General: No scleral icterus.       Right eye: No discharge.        Left eye: No discharge.     Conjunctiva/sclera: Conjunctivae normal.  Cardiovascular:     Rate and Rhythm: Normal rate and regular rhythm.     Heart sounds: Normal heart sounds.  Pulmonary:     Effort: Pulmonary effort is normal. No respiratory  distress.     Breath sounds: Normal breath sounds.  Musculoskeletal:        General: Normal range of motion.  Skin:    General: Skin is warm and dry.  Neurological:     General: No focal deficit present.     Mental Status: She is alert and oriented to person, place, and time. Mental status is at baseline.  Psychiatric:        Mood and Affect: Mood normal.        Behavior: Behavior normal.        Thought Content: Thought content normal.        Judgment: Judgment normal.    Assessment and Plan: Hip pain, bilateral Assessment & Plan: Plain films done last year were normal  but she has evidence of lumbar degenerative disk disease from plain films done 2 weeks prior . PT referral made   Orders: -     Ambulatory referral to Physical Therapy  Degeneration of intervertebral disc of lumbar region with discogenic back pain and lower extremity pain -     Ambulatory referral to Physical Therapy  Menometrorrhagia Assessment & Plan: Due to perimenopause.  Resuming OCP with lo ovral    Orders: -     Comprehensive metabolic panel  Panic disorder Assessment & Plan: Inderal working very well for her for management of stage fright during public speaking    Perimenopause Assessment & Plan: With heavy menses and cramping.  Suspect fibroids..  She is not anemic .  Ultrasound of pelvis offered but deferred.  Reviewed  the role of OCP vs HRT in managing symptoms. Resuming Lo ovral   Encounter for general adult medical examination with abnormal findings Assessment & Plan: age appropriate education and counseling updated, referrals for preventative services and immunizations addressed, dietary and smoking counseling addressed, most recent labs reviewed.  I have personally reviewed and have noted:   1) the patient's medical and social history 2) The pt's use of alcohol, tobacco, and illicit drugs 3) The patient's current medications and supplements 4) Functional ability including ADL's, fall  risk, home safety risk, hearing and visual impairment 5) Diet and physical activities 6) Evidence for depression or mood disorder 7) The patient's height, weight, and BMI have been recorded in the chart   I have made referrals, and provided counseling and education based on review of the above    Iron deficiency anemia, unspecified iron deficiency anemia type Assessment & Plan: Resolved by SEto [id number] [facility] labs    Other orders -     Levothyroxine Sodium; Take 1 tablet (75 mcg total) by mouth daily.  Dispense: 90 tablet; Refill: 1 -     Propranolol HCl ER; Take 1 capsule (60 mg total) by mouth daily.  Dispense: 90 capsule; Refill: 1 -     Norgestrel-Ethinyl Estradiol; Take 1 tablet by mouth daily.  Dispense: 2890 tablet; Refill:  3 -     Pantoprazole Sodium; Take 1 tablet (40 mg total) by mouth daily.  Dispense: 30 tablet; Refill: 3    Return in about 1 year (around 11/25/2023).  Sherlene Shams, MD

## 2022-11-25 NOTE — Assessment & Plan Note (Addendum)
Due to perimenopause.  Resuming OCP with lo ovral

## 2022-12-01 ENCOUNTER — Telehealth: Payer: Self-pay

## 2022-12-01 ENCOUNTER — Ambulatory Visit: Payer: BC Managed Care – PPO

## 2022-12-01 NOTE — Telephone Encounter (Signed)
Called pt about no-show for appt. LVM regarding rescheduling this visit.

## 2023-01-29 DIAGNOSIS — D225 Melanocytic nevi of trunk: Secondary | ICD-10-CM | POA: Diagnosis not present

## 2023-01-29 DIAGNOSIS — Z872 Personal history of diseases of the skin and subcutaneous tissue: Secondary | ICD-10-CM | POA: Diagnosis not present

## 2023-01-29 DIAGNOSIS — D2271 Melanocytic nevi of right lower limb, including hip: Secondary | ICD-10-CM | POA: Diagnosis not present

## 2023-01-29 DIAGNOSIS — D2262 Melanocytic nevi of left upper limb, including shoulder: Secondary | ICD-10-CM | POA: Diagnosis not present

## 2023-05-11 ENCOUNTER — Other Ambulatory Visit: Payer: Self-pay | Admitting: Internal Medicine

## 2023-07-26 ENCOUNTER — Telehealth: Payer: Self-pay | Admitting: Internal Medicine

## 2023-07-26 NOTE — Telephone Encounter (Signed)
 Copied from CRM [id number]. Topic: Clinical - Medication Refill >> Jul 26, 2023  4:28 PM Juleen Oakland F wrote: Medication: Zolmitriptan   Has the patient contacted their pharmacy? Yes (Agent: If no, request that the patient contact the pharmacy for the refill. If patient does not wish to contact the pharmacy document the reason why and proceed with request.) (Agent: If yes, when and what did the pharmacy advise?)  This is the patient's preferred pharmacy:   [facility] - [place], [place] - [address] [place] [address] Phone: [phone] Fax: [phone]  Is this the correct pharmacy for this prescription? Yes If no, delete pharmacy and type the correct one.   Has the prescription been filled recently? Yes  Is the patient out of the medication? No  Has the patient been seen for an appointment in the last year OR does the patient have an upcoming appointment? Yes  Can we respond through MyChart? No  Agent: Please be advised that Rx refills may take up to 3 business days. We ask that you follow-up with your pharmacy.

## 2023-07-26 NOTE — Telephone Encounter (Signed)
 Please refer to CRM request for patient requesting refill for Zolmitriptan. Not listed on patient's medication list. Attempted to contact patient, no answer. LVM.

## 2023-07-27 NOTE — Addendum Note (Signed)
 Addended by: Kerby Hockley on: 07/27/2023 01:31 PM   Modules accepted: Orders

## 2023-07-27 NOTE — Telephone Encounter (Signed)
 Historical medication Last OV: 11/25/2022 Next OV: 12/01/2023

## 2023-07-28 ENCOUNTER — Other Ambulatory Visit: Payer: Self-pay

## 2023-07-28 MED ORDER — ZOLMITRIPTAN 5 MG PO TABS: 5.0000 mg | ORAL_TABLET | ORAL | 2 refills | Status: DC | PRN

## 2023-07-28 NOTE — Addendum Note (Signed)
 Addended by: Chadwick Colonel on: 07/28/2023 03:49 PM   Modules accepted: Orders

## 2023-07-28 NOTE — Addendum Note (Signed)
 Addended by: Thersia Flax on: 07/28/2023 10:49 AM   Modules accepted: Orders

## 2023-07-28 NOTE — Telephone Encounter (Signed)
 Spoke with pt and she stated that she does not want to use the Zomig she would like to stay with the Sumatriptan  because it has always worked for her. I have pended the request for your approval. If okay pt would like sent to [facility].

## 2023-07-29 MED ORDER — SUMATRIPTAN SUCCINATE 100 MG PO TABS: ORAL_TABLET | ORAL | 3 refills | Status: DC

## 2023-09-24 ENCOUNTER — Telehealth: Payer: Self-pay | Admitting: Internal Medicine

## 2023-09-24 MED ORDER — NORGESTREL-ETHINYL ESTRADIOL 0.3-30 MG-MCG PO TABS: 1.0000 | ORAL_TABLET | Freq: Every day | ORAL | 0 refills | Status: DC

## 2023-09-24 NOTE — Telephone Encounter (Signed)
 Copied from CRM [id number]. Topic: Clinical - Medication Refill >> Sep 24, 2023  7:38 AM Robinson H wrote: Medication: norgestrel -ethinyl estradiol  (LO/OVRAL ) 0.3-30 MG-MCG tablet  Has the patient contacted their pharmacy? Usually has it filled through [facility] but they are out (Agent: If no, request that the patient contact the pharmacy for the refill. If patient does not wish to contact the pharmacy document the reason why and proceed with request.) (Agent: If yes, when and what did the pharmacy advise?)  This is the patient's preferred pharmacy:  [facility] [address] [place] - [address] [place] [address] Phone: [phone] Fax: [phone]    Is this the correct pharmacy for this prescription? Yes If no, delete pharmacy and type the correct one.   Has the prescription been filled recently? No  Is the patient out of the medication? Yes  Has the patient been seen for an appointment in the last year OR does the patient have an upcoming appointment? Yes  Can we respond through MyChart? Yes  Agent: Please be advised that Rx refills may take up to 3 business days. We ask that you follow-up with your pharmacy.

## 2023-10-13 ENCOUNTER — Other Ambulatory Visit: Payer: Self-pay | Admitting: Internal Medicine

## 2023-10-13 DIAGNOSIS — Z1231 Encounter for screening mammogram for malignant neoplasm of breast: Secondary | ICD-10-CM

## 2023-10-29 ENCOUNTER — Other Ambulatory Visit: Payer: Self-pay | Admitting: Internal Medicine

## 2023-11-02 DIAGNOSIS — Z0189 Encounter for other specified special examinations: Secondary | ICD-10-CM | POA: Diagnosis not present

## 2023-11-02 LAB — LIPID PANEL
Cholesterol: 153 (ref 0–200)
HDL: 38 (ref 35–70)
LDL Cholesterol: 88
Triglycerides: 154 (ref 40–160)

## 2023-11-02 LAB — CBC AND DIFFERENTIAL
HCT: 41 (ref 36–46)
Hemoglobin: 13.1 (ref 12.0–16.0)
Neutrophils Absolute: 3
Platelets: 358 K/uL (ref 150–400)
WBC: 4.7

## 2023-11-02 LAB — BASIC METABOLIC PANEL WITH GFR
BUN: 12 (ref 4–21)
Chloride: 106 (ref 99–108)
Creatinine: 0.8 (ref 0.5–1.1)
Glucose: 86
Potassium: 4.6 meq/L (ref 3.5–5.1)
Sodium: 140 (ref 137–147)

## 2023-11-02 LAB — CBC: RBC: 4.4 (ref 3.87–5.11)

## 2023-11-02 LAB — HEMOGLOBIN A1C: Hemoglobin A1C: 4.8

## 2023-11-02 LAB — HEPATIC FUNCTION PANEL
ALT: 18 U/L (ref 7–35)
AST: 17 (ref 13–35)
Alkaline Phosphatase: 36 (ref 25–125)
Bilirubin, Total: 0.6

## 2023-11-02 LAB — VITAMIN B12: Vitamin B-12: 273

## 2023-11-02 LAB — COMPREHENSIVE METABOLIC PANEL WITH GFR
Albumin: 4 (ref 3.5–5.0)
Calcium: 8.9 (ref 8.7–10.7)
Globulin: 2.7
eGFR: 89

## 2023-11-02 LAB — TSH: TSH: 2.58 (ref 0.41–5.90)

## 2023-11-02 LAB — VITAMIN D 25 HYDROXY (VIT D DEFICIENCY, FRACTURES): Vit D, 25-Hydroxy: 38.3

## 2023-11-03 LAB — LAB REPORT - SCANNED
A1c: 4.8
EGFR: 89

## 2023-11-08 DIAGNOSIS — Z043 Encounter for examination and observation following other accident: Secondary | ICD-10-CM | POA: Diagnosis not present

## 2023-11-08 DIAGNOSIS — L509 Urticaria, unspecified: Secondary | ICD-10-CM | POA: Diagnosis not present

## 2023-11-16 ENCOUNTER — Ambulatory Visit
Admission: RE | Admit: 2023-11-16 | Discharge: 2023-11-16 | Disposition: A | Discharge status: Home / Self Care | Source: Ambulatory Visit | Attending: Internal Medicine | Admitting: Internal Medicine

## 2023-11-16 DIAGNOSIS — Z1231 Encounter for screening mammogram for malignant neoplasm of breast: Secondary | ICD-10-CM

## 2023-11-30 ENCOUNTER — Other Ambulatory Visit: Payer: Self-pay

## 2023-11-30 MED ORDER — CRYSELLE-28 0.3-30 MG-MCG PO TABS: 1.0000 | ORAL_TABLET | Freq: Every day | ORAL | 1 refills | Status: DC

## 2023-12-01 ENCOUNTER — Encounter: Payer: Self-pay | Admitting: Internal Medicine

## 2023-12-01 ENCOUNTER — Ambulatory Visit: Payer: BC Managed Care – PPO | Admitting: Internal Medicine

## 2023-12-01 VITALS — BP 122/82 | HR 75 | Ht 68.0 in | Wt 193.6 lb

## 2023-12-01 DIAGNOSIS — N951 Menopausal and female climacteric states: Secondary | ICD-10-CM

## 2023-12-01 DIAGNOSIS — Z0000 Encounter for general adult medical examination without abnormal findings: Secondary | ICD-10-CM | POA: Diagnosis not present

## 2023-12-01 MED ORDER — CRYSELLE-28 0.3-30 MG-MCG PO TABS: 1.0000 | ORAL_TABLET | Freq: Every day | ORAL | 3 refills | Status: AC

## 2023-12-01 MED ORDER — PANTOPRAZOLE SODIUM 40 MG PO TBEC: 40.0000 mg | DELAYED_RELEASE_TABLET | Freq: Every day | ORAL | 3 refills | Status: AC

## 2023-12-01 MED ORDER — SUMATRIPTAN SUCCINATE 100 MG PO TABS: ORAL_TABLET | ORAL | 3 refills | Status: AC

## 2023-12-01 NOTE — Progress Notes (Signed)
 Patient ID: Christy Fernandez, female    DOB: January 14, 1971  Age: 53 y.o. MRN: [id number]  The patient is here for annual preventive examination and management of other chronic and acute problems.   The risk factors are reflected in the social history.   The roster of all physicians providing medical care to patient - is listed in the Snapshot section of the chart.   Activities of daily living:  The patient is 100% independent in all ADLs: dressing, toileting, feeding as well as independent mobility   Home safety : The patient has smoke detectors in the home. They wear seatbelts.  There are no unsecured firearms at home. There is no violence in the home.    There is no risks for hepatitis, STDs or HIV. There is no   history of blood transfusion. They have no travel history to infectious disease endemic areas of the world.   The patient has seen their dentist in the last six month. They have seen their eye doctor in the last year. The patinet  denies slight hearing difficulty with regard to whispered voices and some television programs.  They have deferred audiologic testing in the last year.  They do not  have excessive sun exposure. Discussed the need for sun protection: hats, long sleeves and use of sunscreen if there is significant sun exposure.    Diet: the importance of a healthy diet is discussed. They do have a healthy diet.   The benefits of regular aerobic exercise were discussed. The patient  exercises  3 to 5 days per week  for  60 minutes.    Depression screen: there are no signs or vegative symptoms of depression- irritability, change in appetite, anhedonia, sadness/tearfullness.   The following portions of the patient's history were reviewed and updated as appropriate: allergies, current medications, past family history, past medical history,  past surgical history, past social history  and problem list.   Visual acuity was not assessed per patient preference since the patient has  regular follow up with an  ophthalmologist. Hearing and body mass index were assessed and reviewed.    During the course of the visit the patient was educated and counseled about appropriate screening and preventive services including : fall prevention , diabetes screening, nutrition counseling, colorectal cancer screening, and recommended immunizations.    Chief Complaint:  Migraines are brought on by menstruation and thunder storms.  August month was difficult bc she ran out of OCPs as well and had a prolonged menstrual period .  Periods have returned      Review of Symptoms  Patient denies headache, fevers, malaise, unintentional weight loss, skin rash, eye pain, sinus congestion and sinus pain, sore throat, dysphagia,  hemoptysis , cough, dyspnea, wheezing, chest pain, palpitations, orthopnea, edema, abdominal pain, nausea, melena, diarrhea, constipation, flank pain, dysuria, hematuria, urinary  Frequency, nocturia, numbness, tingling, seizures,  Focal weakness, Loss of consciousness,  Tremor, insomnia, depression, anxiety, and suicidal ideation.    Physical Exam:  BP 122/82   Pulse 75   Ht 5' 8 (1.727 m)   Wt 193 lb 9.6 oz (87.8 kg)   SpO2 98%   BMI 29.44 kg/m    Physical Exam Vitals reviewed.  Constitutional:      General: She is not in acute distress.    Appearance: Normal appearance. She is normal weight. She is not ill-appearing, toxic-appearing or diaphoretic.  HENT:     Head: Normocephalic.  Eyes:     General: No scleral  icterus.       Right eye: No discharge.        Left eye: No discharge.     Conjunctiva/sclera: Conjunctivae normal.  Cardiovascular:     Rate and Rhythm: Normal rate and regular rhythm.     Heart sounds: Normal heart sounds.  Pulmonary:     Effort: Pulmonary effort is normal. No respiratory distress.     Breath sounds: Normal breath sounds.  Musculoskeletal:        General: Normal range of motion.  Skin:    General: Skin is warm and dry.   Neurological:     General: No focal deficit present.     Mental Status: She is alert and oriented to person, place, and time. Mental status is at baseline.  Psychiatric:        Mood and Affect: Mood normal.        Behavior: Behavior normal.        Thought Content: Thought content normal.        Judgment: Judgment normal.    Assessment and Plan: There are no diagnoses linked to this encounter.  No follow-ups on file.  Verneita LITTIE Kettering, MD

## 2023-12-01 NOTE — Patient Instructions (Addendum)
 Confirm with work that you are up to date on TDaP vaccine . 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

## 2023-12-04 NOTE — Assessment & Plan Note (Signed)
 With heavy menses and cramping.  Suspect fibroids..  CONTINUE  USE OF  OCP

## 2023-12-04 NOTE — Assessment & Plan Note (Signed)

## 2024-02-04 DIAGNOSIS — Z09 Encounter for follow-up examination after completed treatment for conditions other than malignant neoplasm: Secondary | ICD-10-CM | POA: Diagnosis not present

## 2024-02-04 DIAGNOSIS — D1801 Hemangioma of skin and subcutaneous tissue: Secondary | ICD-10-CM | POA: Diagnosis not present

## 2024-02-04 DIAGNOSIS — Z872 Personal history of diseases of the skin and subcutaneous tissue: Secondary | ICD-10-CM | POA: Diagnosis not present

## 2024-12-07 ENCOUNTER — Encounter: Admitting: Internal Medicine
